# Patient Record
Sex: Female | Born: 1985 | Race: Black or African American | Hispanic: No | Marital: Single | Smoking: Never smoker
Health system: Southern US, Community
[De-identification: ages and names within clinical notes are randomized; demographics above are authoritative.]

## PROBLEM LIST (undated history)

## (undated) DIAGNOSIS — R87810 Cervical high risk human papillomavirus (HPV) DNA test positive: Secondary | ICD-10-CM

## (undated) DIAGNOSIS — G43909 Migraine, unspecified, not intractable, without status migrainosus: Secondary | ICD-10-CM

## (undated) DIAGNOSIS — R87610 Atypical squamous cells of undetermined significance on cytologic smear of cervix (ASC-US): Secondary | ICD-10-CM

## (undated) DIAGNOSIS — J45909 Unspecified asthma, uncomplicated: Secondary | ICD-10-CM

## (undated) HISTORY — DX: Atypical squamous cells of undetermined significance on cytologic smear of cervix (ASC-US): R87.610

## (undated) HISTORY — DX: Cervical high risk human papillomavirus (HPV) DNA test positive: R87.810

---

## 2012-06-27 ENCOUNTER — Emergency Department (HOSPITAL_COMMUNITY): Payer: Self-pay

## 2012-06-27 ENCOUNTER — Emergency Department (HOSPITAL_COMMUNITY)
Admission: EM | Admit: 2012-06-27 | Discharge: 2012-06-27 | Disposition: A | Discharge status: Home / Self Care | Payer: Self-pay | Attending: Emergency Medicine | Admitting: Emergency Medicine

## 2012-06-27 ENCOUNTER — Encounter (HOSPITAL_COMMUNITY): Payer: Self-pay | Admitting: *Deleted

## 2012-06-27 DIAGNOSIS — J40 Bronchitis, not specified as acute or chronic: Secondary | ICD-10-CM | POA: Insufficient documentation

## 2012-06-27 DIAGNOSIS — J329 Chronic sinusitis, unspecified: Secondary | ICD-10-CM | POA: Insufficient documentation

## 2012-06-27 DIAGNOSIS — Z79899 Other long term (current) drug therapy: Secondary | ICD-10-CM | POA: Insufficient documentation

## 2012-06-27 MED ORDER — AMOXICILLIN 500 MG PO CAPS: 500.0000 mg | ORAL_CAPSULE | Freq: Three times a day (TID) | ORAL | Status: DC

## 2012-06-27 NOTE — ED Provider Notes (Addendum)
History     CSN: [id number]  Arrival date & time 06/27/12  1229   First MD Initiated Contact with Patient 06/27/12 1302      Chief Complaint  Patient presents with  . Cough    (Consider location/radiation/quality/duration/timing/severity/associated sxs/prior treatment) Patient is a 26 y.o. female presenting with cough. The history is provided by the patient (pt complains of a cough). No language interpreter was used.  Cough This is a new problem. The current episode started 12 to 24 hours ago. The problem occurs constantly. The problem has not changed since onset.The cough is non-productive. There has been no fever. Pertinent negatives include no chest pain and no headaches. The treatment provided no relief. Risk factors: nothing. She is not a smoker. Her past medical history does not include bronchitis or pneumonia.    History reviewed. No pertinent past medical history.  History reviewed. No pertinent past surgical history.  History reviewed. No pertinent family history.  History  Substance Use Topics  . Smoking status: Never Smoker   . Smokeless tobacco: Not on file  . Alcohol Use: No    OB History    Grav Para Term Preterm Abortions TAB SAB Ect Mult Living                  Review of Systems  Constitutional: Negative for fatigue.  HENT: Negative for congestion, sinus pressure and ear discharge.   Eyes: Negative for discharge.  Respiratory: Positive for cough.   Cardiovascular: Negative for chest pain.  Gastrointestinal: Negative for abdominal pain and diarrhea.  Genitourinary: Negative for frequency and hematuria.  Musculoskeletal: Negative for back pain.  Skin: Negative for rash.  Neurological: Negative for seizures and headaches.  Hematological: Negative.   Psychiatric/Behavioral: Negative for hallucinations.    Allergies  Review of patient's allergies indicates no known allergies.  Home Medications   Current Outpatient Rx  Name  Route  Sig  Dispense   Refill  . ASPIRIN-ACETAMINOPHEN-CAFFEINE 250-250-65 MG PO TABS   Oral   Take 2 tablets by mouth. Migraine headaches         . PHENYLEPH-DOXYLAMINE-DM-APAP 5-6.25-10-325 MG PO CAPS   Oral   Take 1-2 capsules by mouth at bedtime.         . AMOXICILLIN 500 MG PO CAPS   Oral   Take 1 capsule (500 mg total) by mouth 3 (three) times daily.   21 capsule   0     BP 114/79  Pulse 74  Temp 98.2 F (36.8 C) (Oral)  Resp 18  Ht 5\' 2"  (1.575 m)  Wt 208 lb (94.348 kg)  BMI 38.04 kg/m2  SpO2 98%  LMP 05/13/2012  Physical Exam  Constitutional: She is oriented to person, place, and time. She appears well-developed.  HENT:  Head: Normocephalic and atraumatic.  Eyes: Conjunctivae normal and EOM are normal. No scleral icterus.  Neck: Neck supple. No thyromegaly present.  Cardiovascular: Normal rate and regular rhythm.  Exam reveals no gallop and no friction rub.   No murmur heard. Pulmonary/Chest: No stridor. She has no wheezes. She has no rales. She exhibits no tenderness.  Abdominal: She exhibits no distension. There is no tenderness. There is no rebound.  Musculoskeletal: Normal range of motion. She exhibits no edema.  Lymphadenopathy:    She has no cervical adenopathy.  Neurological: She is oriented to person, place, and time. Coordination normal.  Skin: No rash noted. No erythema.  Psychiatric: She has a normal mood and affect. Her behavior is  normal.    ED Course  Procedures (including critical care time)  Labs Reviewed - No data to display Dg Chest 2 View  06/27/2012  *RADIOLOGY REPORT*  Clinical Data: Cough  CHEST - 2 VIEW  Comparison: None.  Findings:  Lungs clear.  Heart size and pulmonary vascularity are normal.  No adenopathy.  No bone lesions.  IMPRESSION: Lungs clear.   Original Report Authenticated By: Bretta Bang, M.D.      1. Bronchitis   2. Sinusitis       MDM          Benny Lennert, MD 06/27/12 1440  Benny Lennert, MD 07/20/12  [phone]

## 2012-06-27 NOTE — ED Notes (Signed)
Cough,nasal congestion sputum yellow -green, sore throat.  Felt she had a fever.

## 2013-04-14 ENCOUNTER — Emergency Department (HOSPITAL_COMMUNITY)
Admission: EM | Admit: 2013-04-14 | Discharge: 2013-04-14 | Disposition: A | Discharge status: Home / Self Care | Payer: Self-pay | Attending: Emergency Medicine | Admitting: Emergency Medicine

## 2013-04-14 ENCOUNTER — Encounter (HOSPITAL_COMMUNITY): Payer: Self-pay | Admitting: Emergency Medicine

## 2013-04-14 DIAGNOSIS — G43909 Migraine, unspecified, not intractable, without status migrainosus: Secondary | ICD-10-CM | POA: Insufficient documentation

## 2013-04-14 DIAGNOSIS — J209 Acute bronchitis, unspecified: Secondary | ICD-10-CM | POA: Insufficient documentation

## 2013-04-14 DIAGNOSIS — J029 Acute pharyngitis, unspecified: Secondary | ICD-10-CM | POA: Insufficient documentation

## 2013-04-14 DIAGNOSIS — J40 Bronchitis, not specified as acute or chronic: Secondary | ICD-10-CM

## 2013-04-14 HISTORY — DX: Migraine, unspecified, not intractable, without status migrainosus: G43.909

## 2013-04-14 LAB — RAPID STREP SCREEN (MED CTR MEBANE ONLY): Streptococcus, Group A Screen (Direct): NEGATIVE

## 2013-04-14 MED ORDER — IBUPROFEN 400 MG PO TABS
600.0000 mg | ORAL_TABLET | Freq: Once | ORAL | Status: AC
  Administered 2013-04-14: 600 mg via ORAL
  Filled 2013-04-14: qty 2

## 2013-04-14 MED ORDER — AZITHROMYCIN 250 MG PO TABS: 250.0000 mg | ORAL_TABLET | Freq: Every day | ORAL | Status: DC

## 2013-04-14 NOTE — ED Provider Notes (Addendum)
CSN: [id number]     Arrival date & time 04/14/13  1233 History   First MD Initiated Contact with Patient 04/14/13 1312     Chief Complaint  Patient presents with  . Chest Pain  . Shortness of Breath  . Sore Throat    HPI Patient reports sore throat ear ache and some productive cough over the past 2-3 days.  No fever or chills.  No nausea or vomiting.  No chest pain.  No abdominal pain.  Symptoms are mild in severity.  Nothing worsens or improves her symptoms.  No recent sick contacts.  Went to work today and was sent home by her foster.   Past Medical History  Diagnosis Date  . Migraine    History reviewed. No pertinent past surgical history. No family history on file. History  Substance Use Topics  . Smoking status: Never Smoker   . Smokeless tobacco: Not on file  . Alcohol Use: No   OB History   Grav Para Term Preterm Abortions TAB SAB Ect Mult Living                 Review of Systems  All other systems reviewed and are negative.    Allergies  Review of patient's allergies indicates no known allergies.  Home Medications   Current Outpatient Rx  Name  Route  Sig  Dispense  Refill  . aspirin-acetaminophen-caffeine (EXCEDRIN MIGRAINE) 250-250-65 MG per tablet   Oral   Take 2 tablets by mouth every 6 (six) hours as needed (migraine). Migraine headaches         . DM-Phenylephrine-Acetaminophen (TYLENOL COLD MULTI-SYMPTOM) 10-5-325 MG/15ML LIQD   Oral   Take 30 mLs by mouth every 4 (four) hours as needed (congestion/sore throat).         . SUMAtriptan (IMITREX) 50 MG tablet   Oral   Take 50 mg by mouth every 2 (two) hours as needed for migraine. May repeat in 2 hours if headache persists or recurs.         Marland Kitchen         0    BP 112/60  Pulse 69  Temp(Src) 98.2 F (36.8 C) (Oral)  Resp 20  SpO2 100%  LMP 03/31/2013 Physical Exam  Nursing note and vitals reviewed. Constitutional: She is oriented to person, place, and time. She appears well-developed and  well-nourished. No distress.  HENT:  Head: Normocephalic and atraumatic.  Eyes: EOM are normal.  Neck: Normal range of motion.  Cardiovascular: Normal rate, regular rhythm and normal heart sounds.   Pulmonary/Chest: Effort normal and breath sounds normal.  Abdominal: Soft. She exhibits no distension. There is no tenderness.  Musculoskeletal: Normal range of motion.  Neurological: She is alert and oriented to person, place, and time.  Skin: Skin is warm and dry.  Psychiatric: She has a normal mood and affect. Judgment normal.    ED Course  Procedures (including critical care time) Labs Review Labs Reviewed  RAPID STREP SCREEN  CULTURE, GROUP A STREP   Imaging Review No results found.  MDM   1. Bronchitis    Likely upper respiratory tract infections/bronchitis. Could represent atypical PNA The patient is well-appearing.  She is nontoxic.  No hypoxia on exam.      Lyanne Co, MD 04/14/13 1411  Lyanne Co, MD 04/14/13 1411  Lyanne Co, MD 04/14/13 [phone]

## 2013-04-14 NOTE — ED Notes (Signed)
Thursday night pt developed a sore throat and states ear were hurting.  Pt has a cough that has started Friday

## 2013-04-14 NOTE — ED Notes (Signed)
Vital signs stable. 

## 2013-04-14 NOTE — ED Notes (Signed)
Pt states started feeling SOB last night while singing and feels it in her chest, soreness.

## 2013-04-14 NOTE — ED Notes (Signed)
Pt alert & oriented x4, stable gait. Patient given discharge instructions, paperwork & prescription(s). Patient  instructed to stop at the registration desk to finish any additional paperwork. Patient verbalized understanding. Pt left department w/ no further questions. 

## 2013-04-14 NOTE — ED Notes (Signed)
Pt states that she has had generalized aches and pain and some sob when she sings since she had the cough.  She wakes up at night sometimes sweating

## 2013-04-16 LAB — CULTURE, GROUP A STREP

## 2013-12-13 ENCOUNTER — Emergency Department (HOSPITAL_COMMUNITY)
Admission: EM | Admit: 2013-12-13 | Discharge: 2013-12-14 | Disposition: A | Discharge status: Home / Self Care | Payer: Self-pay | Attending: Emergency Medicine | Admitting: Emergency Medicine

## 2013-12-13 ENCOUNTER — Encounter (HOSPITAL_COMMUNITY): Payer: Self-pay | Admitting: Emergency Medicine

## 2013-12-13 ENCOUNTER — Emergency Department (HOSPITAL_COMMUNITY): Payer: Self-pay

## 2013-12-13 DIAGNOSIS — J45909 Unspecified asthma, uncomplicated: Secondary | ICD-10-CM | POA: Insufficient documentation

## 2013-12-13 DIAGNOSIS — G43909 Migraine, unspecified, not intractable, without status migrainosus: Secondary | ICD-10-CM | POA: Insufficient documentation

## 2013-12-13 DIAGNOSIS — Z3202 Encounter for pregnancy test, result negative: Secondary | ICD-10-CM | POA: Insufficient documentation

## 2013-12-13 DIAGNOSIS — Z792 Long term (current) use of antibiotics: Secondary | ICD-10-CM | POA: Insufficient documentation

## 2013-12-13 DIAGNOSIS — Z79899 Other long term (current) drug therapy: Secondary | ICD-10-CM | POA: Insufficient documentation

## 2013-12-13 DIAGNOSIS — J209 Acute bronchitis, unspecified: Secondary | ICD-10-CM

## 2013-12-13 HISTORY — DX: Unspecified asthma, uncomplicated: J45.909

## 2013-12-13 LAB — URINALYSIS, ROUTINE W REFLEX MICROSCOPIC
Bilirubin Urine: NEGATIVE
GLUCOSE, UA: NEGATIVE mg/dL
HGB URINE DIPSTICK: NEGATIVE
Ketones, ur: NEGATIVE mg/dL
Nitrite: NEGATIVE
PH: 6 (ref 5.0–8.0)
Protein, ur: NEGATIVE mg/dL
SPECIFIC GRAVITY, URINE: 1.025 (ref 1.005–1.030)
Urobilinogen, UA: 0.2 mg/dL (ref 0.0–1.0)

## 2013-12-13 LAB — URINE MICROSCOPIC-ADD ON

## 2013-12-13 LAB — PREGNANCY, URINE: PREG TEST UR: NEGATIVE

## 2013-12-13 NOTE — ED Notes (Signed)
Pt states she has been coughing, using her inhaler for her breathing without improvement. Pt also reports chest discomfort with breathing

## 2013-12-13 NOTE — ED Provider Notes (Signed)
CSN: [id number]     Arrival date & time 12/13/13  2311 History  This chart was scribed for Dione Boozeavid Ludwika Rodd, MD by Quintella ReichertMatthew Underwood, ED scribe.  This patient was seen in room [facility] and the patient's care was started at 11:57 PM.   Chief Complaint  Patient presents with  . Cough    The history is provided by the patient. No language interpreter was used.    HPI Comments: Heather Nicholson is a 28 y.o. female with h/o asthma who presents to the Emergency Department complaining of several hours of cough with associated chest tightness.  Pt states that she was driving about 6 hours ago when her chest suddenly became "tight."  She initially attributed this to indigestion but she then began coughing.  She states "I think I'm having a flare of my bronchitis."  Cough has been intermittently productive of sputum of an unknown color.  She has attempted to treat symptoms with her albuterol inhaler, without relief.  Pt does not smoke.    [facility] [place]   Past Medical History  Diagnosis Date  . Migraine   . Asthma     History reviewed. No pertinent past surgical history.  No family history on file.   History  Substance Use Topics  . Smoking status: Never Smoker   . Smokeless tobacco: Not on file  . Alcohol Use: No    OB History   Grav Para Term Preterm Abortions TAB SAB Ect Mult Living                   Review of Systems  All other systems reviewed and are negative.     Allergies  Review of patient's allergies indicates no known allergies.  Home Medications   Prior to Admission medications   Medication Sig Start Date End Date Taking? Authorizing Provider  albuterol (PROVENTIL HFA;VENTOLIN HFA) 108 (90 BASE) MCG/ACT inhaler Inhale into the lungs every 6 (six) hours as needed for wheezing or shortness of breath.   Yes Historical Provider, MD  aspirin-acetaminophen-caffeine (EXCEDRIN MIGRAINE) [phone] MG per tablet Take 2 tablets by mouth every 6 (six) hours  as needed (migraine). Migraine headaches   Yes Historical Provider, MD  budesonide-formoterol (SYMBICORT) 160-4.5 MCG/ACT inhaler Inhale 2 puffs into the lungs 2 (two) times daily.   Yes Historical Provider, MD  SUMAtriptan (IMITREX) 50 MG tablet Take 50 mg by mouth every 2 (two) hours as needed for migraine. May repeat in 2 hours if headache persists or recurs.   Yes Historical Provider, MD  azithromycin (ZITHROMAX Z-PAK) 250 MG tablet Take 1 tablet (250 mg total) by mouth daily. Take 2 tabs for first dose, then 1 tab for each additional dose 04/14/13   Lyanne CoKevin M Campos, MD  DM-Phenylephrine-Acetaminophen (TYLENOL COLD MULTI-SYMPTOM) 10-5-325 MG/15ML LIQD Take 30 mLs by mouth every 4 (four) hours as needed (congestion/sore throat).    Historical Provider, MD   BP 129/81  Pulse 65  Resp 20  Ht 5\' 2"  (1.575 m)  Wt 208 lb (94.348 kg)  BMI 38.03 kg/m2  SpO2 100%  LMP 11/13/2013  Physical Exam  Nursing note and vitals reviewed. Constitutional: She is oriented to person, place, and time. She appears well-developed and well-nourished. No distress.  HENT:  Head: Normocephalic and atraumatic.  Eyes: EOM are normal. Pupils are equal, round, and reactive to light.  Neck: Neck supple. No JVD present. No tracheal deviation present.  Cardiovascular: Normal rate, regular rhythm and normal heart sounds.  No murmur heard. Pulmonary/Chest: Effort normal. No respiratory distress. She has no wheezes. She has no rales.  Prolonged exhalation phase  Abdominal: Soft. Bowel sounds are normal. She exhibits no distension and no mass. There is no tenderness.  Musculoskeletal: Normal range of motion. She exhibits no edema.  Lymphadenopathy:    She has no cervical adenopathy.  Neurological: She is alert and oriented to person, place, and time. She has normal reflexes. No cranial nerve deficit. Coordination normal.  Skin: Skin is warm and dry. No rash noted.  Psychiatric: She has a normal mood and affect. Her  behavior is normal. Thought content normal.    ED Course  Procedures (including critical care time)  DIAGNOSTIC STUDIES: Oxygen Saturation is 100% on room air, normal by my interpretation.    COORDINATION OF CARE: 12:02 AM-Discussed treatment plan which includes CXR, prednisone, breathing treatment, and labs with pt at bedside and pt agreed to plan.     Labs Review Results for orders placed during the hospital encounter of 12/13/13  URINALYSIS, ROUTINE W REFLEX MICROSCOPIC      Result Value Ref Range   Color, Urine YELLOW  YELLOW   APPearance CLEAR  CLEAR   Specific Gravity, Urine 1.025  1.005 - 1.030   pH 6.0  5.0 - 8.0   Glucose, UA NEGATIVE  NEGATIVE mg/dL   Hgb urine dipstick NEGATIVE  NEGATIVE   Bilirubin Urine NEGATIVE  NEGATIVE   Ketones, ur NEGATIVE  NEGATIVE mg/dL   Protein, ur NEGATIVE  NEGATIVE mg/dL   Urobilinogen, UA 0.2  0.0 - 1.0 mg/dL   Nitrite NEGATIVE  NEGATIVE   Leukocytes, UA TRACE (*) NEGATIVE  PREGNANCY, URINE      Result Value Ref Range   Preg Test, Ur NEGATIVE  NEGATIVE  URINE MICROSCOPIC-ADD ON      Result Value Ref Range   Squamous Epithelial / LPF MANY (*) RARE   WBC, UA 0-2  <3 WBC/hpf   RBC / HPF 0-2  <3 RBC/hpf   Bacteria, UA MANY (*) RARE   Imaging Review Dg Chest 2 View  12/14/2013   CLINICAL DATA:  COUGH  EXAM: CHEST  2 VIEW  COMPARISON:  DG CHEST 2 VIEW dated 06/27/2012  FINDINGS: The heart size and mediastinal contours are within normal limits. Both lungs are clear. The visualized skeletal structures are unremarkable.  IMPRESSION: No active cardiopulmonary disease.   Electronically Signed   By: Salome HolmesHector  Cooper M.D.   On: 12/14/2013 01:15   MDM   Final diagnoses:  Acute bronchitis    Respiratory tract infection with bronchitis. She's given a dose of the prednisone and an albuterol with ipratropium nebulizer treatment with significant improvement. Chest x-ray was obtained to rule out pneumonia and was negative. Urinalysis does have many  bacteria but also many squamous epithelial cells suggesting contaminated specimen. She does not clinically have a UTI. At this time, no indication for antibiotics. She is discharged with a prescription for prednisone. She does have an albuterol inhaler for use at home.    I personally performed the services described in this documentation, which was scribed in my presence. The recorded information has been reviewed and is accurate.    Dione Boozeavid Tarika Mckethan, MD 12/14/13 [phone]

## 2013-12-14 MED ORDER — IPRATROPIUM-ALBUTEROL 0.5-2.5 (3) MG/3ML IN SOLN
3.0000 mL | Freq: Once | RESPIRATORY_TRACT | Status: AC
  Administered 2013-12-14: 3 mL via RESPIRATORY_TRACT
  Filled 2013-12-14: qty 3

## 2013-12-14 MED ORDER — PREDNISONE 50 MG PO TABS
60.0000 mg | ORAL_TABLET | Freq: Once | ORAL | Status: AC
  Administered 2013-12-14: 60 mg via ORAL
  Filled 2013-12-14 (×2): qty 1

## 2013-12-14 MED ORDER — PREDNISONE 20 MG PO TABS: 60.0000 mg | ORAL_TABLET | Freq: Every day | ORAL | Status: DC

## 2013-12-14 NOTE — Discharge Instructions (Signed)
Continue using your inhaler as needed.   Acute Bronchitis Bronchitis is inflammation of the airways that extend from the windpipe into the lungs (bronchi). The inflammation often causes mucus to develop. This leads to a cough, which is the most common symptom of bronchitis.  In acute bronchitis, the condition usually develops suddenly and goes away over time, usually in a couple weeks. Smoking, allergies, and asthma can make bronchitis worse. Repeated episodes of bronchitis may cause further lung problems.  CAUSES Acute bronchitis is most often caused by the same virus that causes a cold. The virus can spread from person to person (contagious).  SIGNS AND SYMPTOMS   Cough.   Fever.   Coughing up mucus.   Body aches.   Chest congestion.   Chills.   Shortness of breath.   Sore throat.  DIAGNOSIS  Acute bronchitis is usually diagnosed through a physical exam. Tests, such as chest X-rays, are sometimes done to rule out other conditions.  TREATMENT  Acute bronchitis usually goes away in a couple weeks. Often times, no medical treatment is necessary. Medicines are sometimes given for relief of fever or cough. Antibiotics are usually not needed but may be prescribed in certain situations. In some cases, an inhaler may be recommended to help reduce shortness of breath and control the cough. A cool mist vaporizer may also be used to help thin bronchial secretions and make it easier to clear the chest.  HOME CARE INSTRUCTIONS  Get plenty of rest.   Drink enough fluids to keep your urine clear or pale yellow (unless you have a medical condition that requires fluid restriction). Increasing fluids may help thin your secretions and will prevent dehydration.   Only take over-the-counter or prescription medicines as directed by your health care provider.   Avoid smoking and secondhand smoke. Exposure to cigarette smoke or irritating chemicals will make bronchitis worse. If you are a  smoker, consider using nicotine gum or skin patches to help control withdrawal symptoms. Quitting smoking will help your lungs heal faster.   Reduce the chances of another bout of acute bronchitis by washing your hands frequently, avoiding people with cold symptoms, and trying not to touch your hands to your mouth, nose, or eyes.   Follow up with your health care provider as directed.  SEEK MEDICAL CARE IF: Your symptoms do not improve after 1 week of treatment.  SEEK IMMEDIATE MEDICAL CARE IF:  You develop an increased fever or chills.   You have chest pain.   You have severe shortness of breath.  You have bloody sputum.   You develop dehydration.  You develop fainting.  You develop repeated vomiting.  You develop a severe headache. MAKE SURE YOU:   Understand these instructions.  Will watch your condition.  Will get help right away if you are not doing well or get worse. Document Released: 08/19/2004 Document Revised: 03/14/2013 Document Reviewed: 01/02/2013 [facility] Patient Information 2014 [place], [place].  Prednisone tablets What is this medicine? PREDNISONE (PRED ni sone) is a corticosteroid. It is commonly used to treat inflammation of the skin, joints, lungs, and other organs. Common conditions treated include asthma, allergies, and arthritis. It is also used for other conditions, such as blood disorders and diseases of the adrenal glands. This medicine may be used for other purposes; ask your health care provider or pharmacist if you have questions. COMMON BRAND NAME(S): Deltasone, Predone, Sterapred DS, Sterapred What should I tell my health care provider before I take this medicine? They need  to know if you have any of these conditions: -Cushing's syndrome -diabetes -glaucoma -heart disease -high blood pressure -infection (especially a virus infection such as chickenpox, cold sores, or herpes) -kidney disease -liver disease -mental  illness -myasthenia gravis -osteoporosis -seizures -stomach or intestine problems -thyroid disease -an unusual or allergic reaction to lactose, prednisone, other medicines, foods, dyes, or preservatives -pregnant or trying to get pregnant -breast-feeding How should I use this medicine? Take this medicine by mouth with a glass of water. Follow the directions on the prescription label. Take this medicine with food. If you are taking this medicine once a day, take it in the morning. Do not take more medicine than you are told to take. Do not suddenly stop taking your medicine because you may develop a severe reaction. Your doctor will tell you how much medicine to take. If your doctor wants you to stop the medicine, the dose may be slowly lowered over time to avoid any side effects. Talk to your pediatrician regarding the use of this medicine in children. Special care may be needed. Overdosage: If you think you have taken too much of this medicine contact a poison control center or emergency room at once. NOTE: This medicine is only for you. Do not share this medicine with others. What if I miss a dose? If you miss a dose, take it as soon as you can. If it is almost time for your next dose, talk to your doctor or health care professional. You may need to miss a dose or take an extra dose. Do not take double or extra doses without advice. What may interact with this medicine? Do not take this medicine with any of the following medications: -metyrapone -mifepristone This medicine may also interact with the following medications: -aminoglutethimide -amphotericin B -aspirin and aspirin-like medicines -barbiturates -certain medicines for diabetes, like glipizide or glyburide -cholestyramine -cholinesterase inhibitors -cyclosporine -digoxin -diuretics -ephedrine -female hormones, like estrogens and birth control pills -isoniazid -ketoconazole -NSAIDS, medicines for pain and inflammation,  like ibuprofen or naproxen -phenytoin -rifampin -toxoids -vaccines -warfarin This list may not describe all possible interactions. Give your health care provider a list of all the medicines, herbs, non-prescription drugs, or dietary supplements you use. Also tell them if you smoke, drink alcohol, or use illegal drugs. Some items may interact with your medicine. What should I watch for while using this medicine? Visit your doctor or health care professional for regular checks on your progress. If you are taking this medicine over a prolonged period, carry an identification card with your name and address, the type and dose of your medicine, and your doctor's name and address. This medicine may increase your risk of getting an infection. Tell your doctor or health care professional if you are around anyone with measles or chickenpox, or if you develop sores or blisters that do not heal properly. If you are going to have surgery, tell your doctor or health care professional that you have taken this medicine within the last twelve months. Ask your doctor or health care professional about your diet. You may need to lower the amount of salt you eat. This medicine may affect blood sugar levels. If you have diabetes, check with your doctor or health care professional before you change your diet or the dose of your diabetic medicine. What side effects may I notice from receiving this medicine? Side effects that you should report to your doctor or health care professional as soon as possible: -allergic reactions like skin rash,  itching or hives, swelling of the face, lips, or tongue -changes in emotions or moods -changes in vision -depressed mood -eye pain -fever or chills, cough, sore throat, pain or difficulty passing urine -increased thirst -swelling of ankles, feet Side effects that usually do not require medical attention (report to your doctor or health care professional if they continue or are  bothersome): -confusion, excitement, restlessness -headache -nausea, vomiting -skin problems, acne, thin and shiny skin -trouble sleeping -weight gain This list may not describe all possible side effects. Call your doctor for medical advice about side effects. You may report side effects to FDA at 1-800-FDA-1088. Where should I keep my medicine? Keep out of the reach of children. Store at room temperature between 15 and 30 degrees C (59 and 86 degrees F). Protect from light. Keep container tightly closed. Throw away any unused medicine after the expiration date. NOTE: This sheet is a summary. It may not cover all possible information. If you have questions about this medicine, talk to your doctor, pharmacist, or health care provider.  2014, [employer]. (2011-02-25 10:57:14)

## 2015-09-08 ENCOUNTER — Emergency Department (HOSPITAL_COMMUNITY): Payer: Self-pay

## 2015-09-08 ENCOUNTER — Encounter (HOSPITAL_COMMUNITY): Payer: Self-pay | Admitting: Emergency Medicine

## 2015-09-08 DIAGNOSIS — G8929 Other chronic pain: Secondary | ICD-10-CM | POA: Insufficient documentation

## 2015-09-08 DIAGNOSIS — Z79899 Other long term (current) drug therapy: Secondary | ICD-10-CM | POA: Insufficient documentation

## 2015-09-08 DIAGNOSIS — J45909 Unspecified asthma, uncomplicated: Secondary | ICD-10-CM | POA: Insufficient documentation

## 2015-09-08 DIAGNOSIS — Z791 Long term (current) use of non-steroidal anti-inflammatories (NSAID): Secondary | ICD-10-CM | POA: Insufficient documentation

## 2015-09-08 DIAGNOSIS — Z7952 Long term (current) use of systemic steroids: Secondary | ICD-10-CM | POA: Insufficient documentation

## 2015-09-08 DIAGNOSIS — Z792 Long term (current) use of antibiotics: Secondary | ICD-10-CM | POA: Insufficient documentation

## 2015-09-08 DIAGNOSIS — M79671 Pain in right foot: Secondary | ICD-10-CM | POA: Insufficient documentation

## 2015-09-08 DIAGNOSIS — G43909 Migraine, unspecified, not intractable, without status migrainosus: Secondary | ICD-10-CM | POA: Insufficient documentation

## 2015-09-08 DIAGNOSIS — Z7951 Long term (current) use of inhaled steroids: Secondary | ICD-10-CM | POA: Insufficient documentation

## 2015-09-08 NOTE — ED Notes (Signed)
Pt states she had right foot injury 1.5 months ago while wearing heels.

## 2015-09-09 ENCOUNTER — Emergency Department (HOSPITAL_COMMUNITY)
Admission: EM | Admit: 2015-09-09 | Discharge: 2015-09-09 | Disposition: A | Discharge status: Home / Self Care | Payer: Self-pay | Attending: Emergency Medicine | Admitting: Emergency Medicine

## 2015-09-09 DIAGNOSIS — M79671 Pain in right foot: Secondary | ICD-10-CM

## 2015-09-09 DIAGNOSIS — G8929 Other chronic pain: Secondary | ICD-10-CM

## 2015-09-09 MED ORDER — NAPROXEN 500 MG PO TABS: 500.0000 mg | ORAL_TABLET | Freq: Two times a day (BID) | ORAL | Status: DC

## 2015-09-09 MED ORDER — NAPROXEN 250 MG PO TABS
500.0000 mg | ORAL_TABLET | Freq: Once | ORAL | Status: AC
  Administered 2015-09-09: 500 mg via ORAL
  Filled 2015-09-09: qty 2

## 2015-09-09 NOTE — Discharge Instructions (Signed)
As discussed, I suspect you may have a ligament or soft tissue injury that is made worse by wearing your high heeled shoes.  Wear the post op shoe to help prevent stress on your foot.  Ice and minimizing stress to the foot can also be helpful. Call the orthopedist listed for further evaluation of your foot.

## 2015-09-10 NOTE — ED Provider Notes (Signed)
CSN: [id number]     Arrival date & time 09/08/15  2138 History   First MD Initiated Contact with Patient 09/09/15 0046     Chief Complaint  Patient presents with  . Foot Pain     (Consider location/radiation/quality/duration/timing/severity/associated sxs/prior Treatment) The history is provided by the patient and a friend.   Heather Nicholson is a 30 y.o. female presenting with right foot pain which has been chronic with waxing and waning symptoms since having an inversion injury of the foot while walking in high heel shoes approximately 6 weeks ago.  She reports tolerable pain during the week but wears heels for church on Sunday and notices increased pain and swelling of the foot by Sunday evening and Monday, then becomes more tolerable. Pain is aching in character, sharp at times and does not radiate. She has taken excedrin without relief.  She denies numbness in the foot or toes.     Past Medical History  Diagnosis Date  . Migraine   . Asthma    History reviewed. No pertinent past surgical history. History reviewed. No pertinent family history. Social History  Substance Use Topics  . Smoking status: Never Smoker   . Smokeless tobacco: None  . Alcohol Use: Yes   OB History    No data available     Review of Systems  Constitutional: Negative for fever.  Musculoskeletal: Positive for joint swelling and arthralgias. Negative for myalgias.  Neurological: Negative for weakness and numbness.      Allergies  Review of patient's allergies indicates no known allergies.  Home Medications   Prior to Admission medications   Medication Sig Start Date End Date Taking? Authorizing Provider  albuterol (PROVENTIL HFA;VENTOLIN HFA) 108 (90 BASE) MCG/ACT inhaler Inhale into the lungs every 6 (six) hours as needed for wheezing or shortness of breath.    Historical Provider, MD  aspirin-acetaminophen-caffeine (EXCEDRIN MIGRAINE) [phone] MG per tablet Take 2 tablets by mouth every 6  (six) hours as needed (migraine). Migraine headaches    Historical Provider, MD  azithromycin (ZITHROMAX Z-PAK) 250 MG tablet Take 1 tablet (250 mg total) by mouth daily. Take 2 tabs for first dose, then 1 tab for each additional dose 04/14/13   Azalia Bilis, MD  budesonide-formoterol [facility]) 160-4.5 MCG/ACT inhaler Inhale 2 puffs into the lungs 2 (two) times daily.    Historical Provider, MD  DM-Phenylephrine-Acetaminophen (TYLENOL COLD MULTI-SYMPTOM) 10-5-325 MG/15ML LIQD Take 30 mLs by mouth every 4 (four) hours as needed (congestion/sore throat).    Historical Provider, MD  naproxen (NAPROSYN) 500 MG tablet Take 1 tablet (500 mg total) by mouth 2 (two) times daily. 09/09/15   Burgess Amor, PA-C  predniSONE (DELTASONE) 20 MG tablet Take 3 tablets (60 mg total) by mouth daily. 12/14/13   Dione Booze, MD  SUMAtriptan (IMITREX) 50 MG tablet Take 50 mg by mouth every 2 (two) hours as needed for migraine. May repeat in 2 hours if headache persists or recurs.    Historical Provider, MD   BP 114/79 mmHg  Pulse 65  Temp(Src) 98.1 F (36.7 C) (Oral)  Resp 20  Ht  (1.575 m)  Wt 98.884 kg  BMI 39.86 kg/m2  SpO2 100%  LMP 09/08/2015 Physical Exam  Constitutional: She appears well-developed and well-nourished.  HENT:  Head: Atraumatic.  Neck: Normal range of motion.  Cardiovascular:  Pulses equal bilaterally  Musculoskeletal: She exhibits tenderness.       Right foot: There is swelling. There is normal range of motion, normal  capillary refill, no crepitus and no deformity.       Feet:  Neurological: She is alert. She has normal strength. She displays normal reflexes. No sensory deficit.  Skin: Skin is warm and dry.  Psychiatric: She has a normal mood and affect.    ED Course  Procedures (including critical care time) Labs Review Labs Reviewed - No data to display  Imaging Review Dg Foot Complete Right  09/08/2015  CLINICAL DATA:  30 year old female with right foot and ankle pain  after stepping off a curb in high heels this past October. Persistent intermittent pain and swelling. EXAM: RIGHT FOOT COMPLETE - 3+ VIEW COMPARISON:  None. FINDINGS: There is no evidence of fracture or dislocation. There is no evidence of arthropathy or other focal bone abnormality. Soft tissues are unremarkable. IMPRESSION: Negative. Electronically Signed   By: Malachy Moan M.D.   On: 09/08/2015 22:41   I have personally reviewed and evaluated these images and lab results as part of my medical decision-making.   EKG Interpretation None      MDM   Final diagnoses:  Chronic foot pain, right      Radiological studies were viewed, interpreted and considered during the medical decision making and disposition process. I agree with radiologists reading.  Results were also discussed with patient. Advised to dc high heel shoes. Prescribed naproxen, encouraged ice, elevation, post op shoe provided.  Ortho f/u recommended for further eval.  Suspect possible ligament/tendon sprain but cannot rule out complete tear or other surgical injury.    The patient appears reasonably screened and/or stabilized for discharge and I doubt any other medical condition or other [facility] requiring further screening, evaluation, or treatment in the ED at this time prior to discharge.      Burgess Amor, PA-C 09/10/15 0981  Layla Maw Ward, DO 09/10/15 0430

## 2016-06-26 IMAGING — DX DG FOOT COMPLETE 3+V*R*
3 series · 3 of 3 positions shown · non-contrast
Comparison: None.

CLINICAL DATA: 30-year-old female with right foot and ankle pain
after stepping off a curb in high heels this past Roly Rolando.
Persistent intermittent pain and swelling.

EXAM:
RIGHT FOOT COMPLETE - 3+ VIEW

[foot ap]
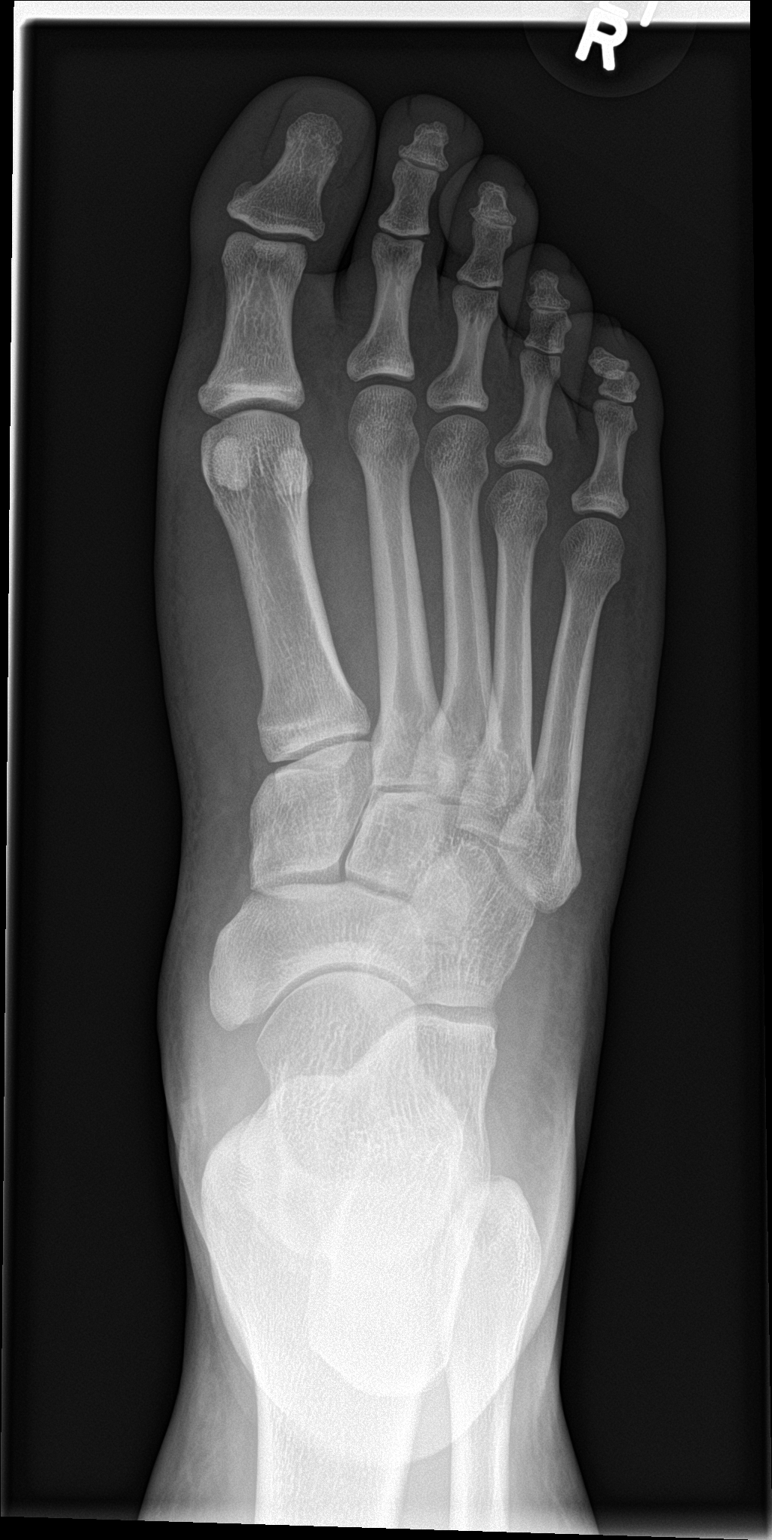

[foot obl]
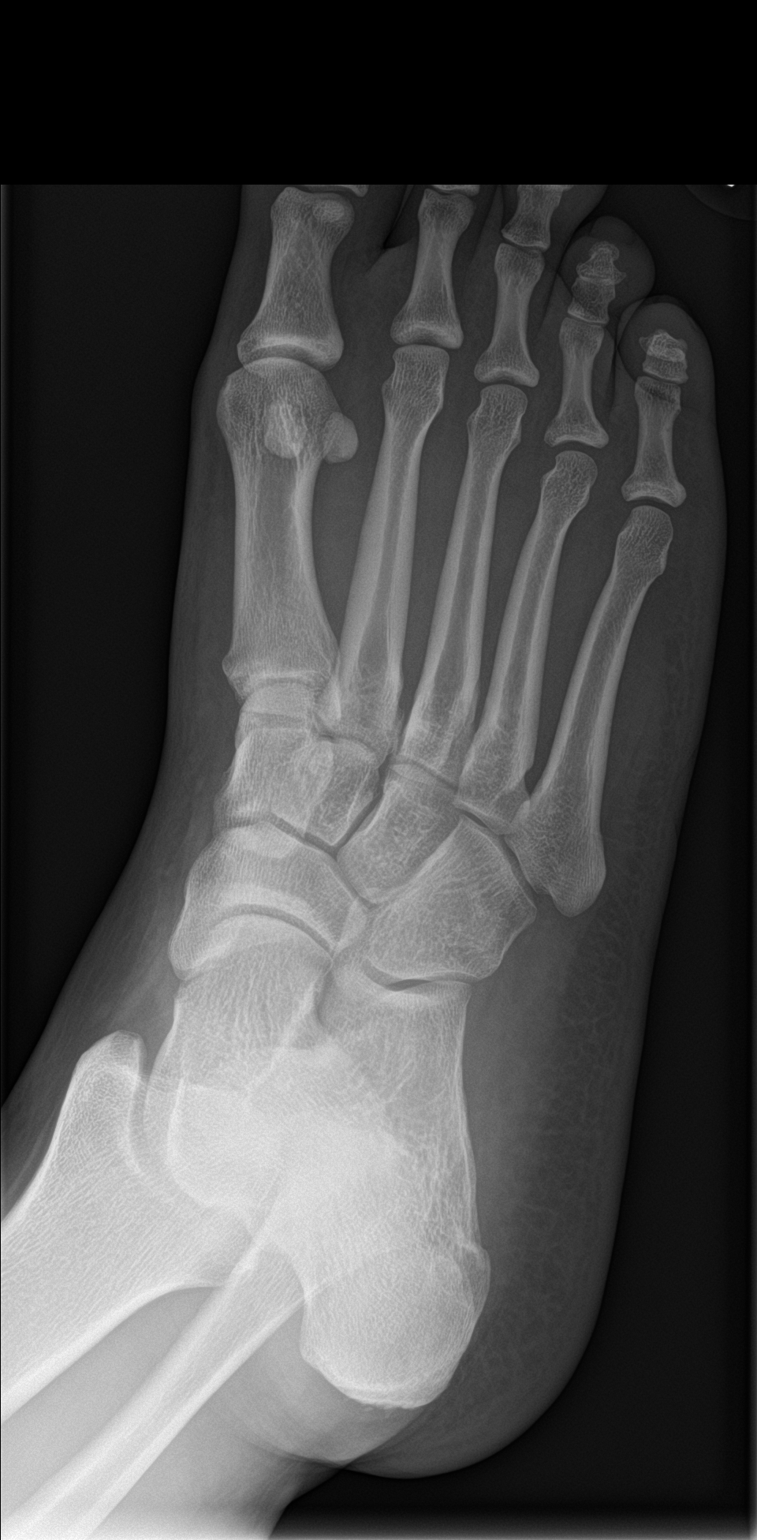

[foot lat]
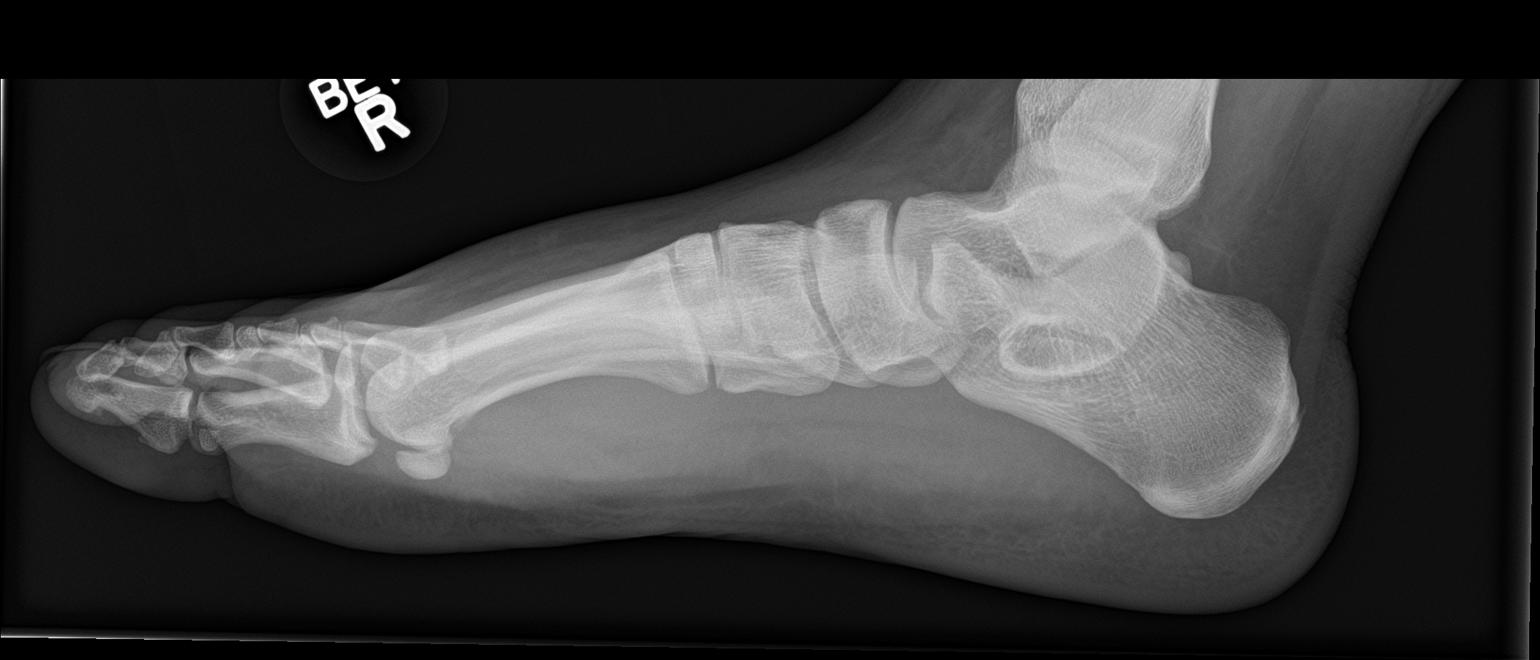

[3 of 3 positions shown; findings below may reference images not displayed]

FINDINGS: There is no evidence of fracture or dislocation. There is no
evidence of arthropathy or other focal bone abnormality. Soft
tissues are unremarkable.
IMPRESSION: Negative.

## 2017-07-10 ENCOUNTER — Other Ambulatory Visit: Payer: Self-pay

## 2017-07-10 ENCOUNTER — Emergency Department (HOSPITAL_COMMUNITY): Payer: BLUE CROSS/BLUE SHIELD

## 2017-07-10 ENCOUNTER — Encounter (HOSPITAL_COMMUNITY): Payer: Self-pay | Admitting: *Deleted

## 2017-07-10 ENCOUNTER — Emergency Department (HOSPITAL_COMMUNITY)
Admission: EM | Admit: 2017-07-10 | Discharge: 2017-07-10 | Disposition: A | Discharge status: Home / Self Care | Payer: BLUE CROSS/BLUE SHIELD | Attending: Emergency Medicine | Admitting: Emergency Medicine

## 2017-07-10 DIAGNOSIS — R197 Diarrhea, unspecified: Secondary | ICD-10-CM | POA: Insufficient documentation

## 2017-07-10 DIAGNOSIS — R1012 Left upper quadrant pain: Secondary | ICD-10-CM | POA: Diagnosis present

## 2017-07-10 DIAGNOSIS — J45909 Unspecified asthma, uncomplicated: Secondary | ICD-10-CM | POA: Insufficient documentation

## 2017-07-10 DIAGNOSIS — Z79899 Other long term (current) drug therapy: Secondary | ICD-10-CM | POA: Insufficient documentation

## 2017-07-10 DIAGNOSIS — R112 Nausea with vomiting, unspecified: Secondary | ICD-10-CM | POA: Insufficient documentation

## 2017-07-10 LAB — URINALYSIS, ROUTINE W REFLEX MICROSCOPIC
BACTERIA UA: NONE SEEN
BILIRUBIN URINE: NEGATIVE
GLUCOSE, UA: NEGATIVE mg/dL
Ketones, ur: NEGATIVE mg/dL
LEUKOCYTES UA: NEGATIVE
NITRITE: NEGATIVE
PROTEIN: NEGATIVE mg/dL
SPECIFIC GRAVITY, URINE: 1.028 (ref 1.005–1.030)
pH: 5 (ref 5.0–8.0)

## 2017-07-10 LAB — COMPREHENSIVE METABOLIC PANEL
ALT: 17 U/L (ref 14–54)
ANION GAP: 7 (ref 5–15)
AST: 19 U/L (ref 15–41)
Albumin: 3.4 g/dL — ABNORMAL LOW (ref 3.5–5.0)
Alkaline Phosphatase: 62 U/L (ref 38–126)
BILIRUBIN TOTAL: 0.7 mg/dL (ref 0.3–1.2)
BUN: 8 mg/dL (ref 6–20)
CHLORIDE: 109 mmol/L (ref 101–111)
CO2: 22 mmol/L (ref 22–32)
Calcium: 8.1 mg/dL — ABNORMAL LOW (ref 8.9–10.3)
Creatinine, Ser: 0.82 mg/dL (ref 0.44–1.00)
GFR calc non Af Amer: >60 mL/min (ref >60)
Glucose, Bld: 92 mg/dL (ref 65–99)
POTASSIUM: 3.6 mmol/L (ref 3.5–5.1)
Sodium: 138 mmol/L (ref 135–145)
TOTAL PROTEIN: 7.1 g/dL (ref 6.5–8.1)

## 2017-07-10 LAB — CBC
HEMATOCRIT: 37.2 % (ref 36.0–46.0)
Hemoglobin: 12.2 g/dL (ref 12.0–15.0)
MCH: 28.9 pg (ref 26.0–34.0)
MCHC: 32.8 g/dL (ref 30.0–36.0)
MCV: 88.2 fL (ref 78.0–100.0)
Platelets: 302 10*3/uL (ref 150–400)
RBC: 4.22 MIL/uL (ref 3.87–5.11)
RDW: 13.4 % (ref 11.5–15.5)
WBC: 5.5 10*3/uL (ref 4.0–10.5)

## 2017-07-10 LAB — I-STAT BETA HCG BLOOD, ED (MC, WL, AP ONLY): I-stat hCG, quantitative: <5 m[IU]/mL (ref <5)

## 2017-07-10 LAB — LIPASE, BLOOD: LIPASE: 36 U/L (ref 11–51)

## 2017-07-10 MED ORDER — IOPAMIDOL (ISOVUE-300) INJECTION 61%
INTRAVENOUS | Status: AC
  Administered 2017-07-10: 100 mL
  Filled 2017-07-10: qty 50

## 2017-07-10 MED ORDER — ONDANSETRON HCL 4 MG PO TABS: 4.0000 mg | ORAL_TABLET | Freq: Four times a day (QID) | ORAL | 0 refills | Status: DC

## 2017-07-10 MED ORDER — KETOROLAC TROMETHAMINE 15 MG/ML IJ SOLN
15.0000 mg | Freq: Once | INTRAMUSCULAR | Status: AC
  Administered 2017-07-10: 15 mg via INTRAVENOUS
  Filled 2017-07-10: qty 1

## 2017-07-10 MED ORDER — MORPHINE SULFATE (PF) 4 MG/ML IV SOLN
4.0000 mg | Freq: Once | INTRAVENOUS | Status: AC
  Administered 2017-07-10: 4 mg via INTRAVENOUS
  Filled 2017-07-10: qty 1

## 2017-07-10 MED ORDER — ONDANSETRON HCL 4 MG/2ML IJ SOLN
4.0000 mg | Freq: Once | INTRAMUSCULAR | Status: AC
  Administered 2017-07-10: 4 mg via INTRAVENOUS
  Filled 2017-07-10: qty 2

## 2017-07-10 NOTE — ED Notes (Signed)
PA at bedside.

## 2017-07-10 NOTE — ED Notes (Signed)
Pt transported to CT ?

## 2017-07-10 NOTE — Discharge Instructions (Signed)
Please read attached information. If you experience any new or worsening signs or symptoms please return to the emergency room for evaluation. Please follow-up with your primary care provider or specialist as discussed. Please use medication prescribed only as directed and discontinue taking if you have any concerning signs or symptoms.   °

## 2017-07-10 NOTE — ED Provider Notes (Signed)
[facility] Provider Note   CSN: [id number] Arrival date & time: 07/10/17  1224     History   Chief Complaint Chief Complaint  Patient presents with  . Abdominal Pain    HPI Heather Nicholson is a 31 y.o. female.  HPI   31 year old female presents today with complaints of 4 days of.  Patient notes initially this started as a sharp stabbing pain in her left flank.  She notes this has progressed to left-sided abdominal pain with associated nausea vomiting and diarrhea.  Patient denies any blood in the vomit or diarrhea.  She denies any fever at home.  Patient notes that she was seen at an outside facility, diagnosed with flank pain and discharged.  Patient reports gave Toradol prior to her disposition today.   Past Medical History:  Diagnosis Date  . Asthma   . Migraine     There are no active problems to display for this patient.   History reviewed. No pertinent surgical history.  OB History    No data available       Home Medications    Prior to Admission medications   Medication Sig Start Date End Date Taking? Authorizing Provider  aspirin-acetaminophen-caffeine (EXCEDRIN MIGRAINE) [phone] MG per tablet Take 2 tablets by mouth every 6 (six) hours as needed (migraine). Migraine headaches   Yes [provider]  Aspirin-Salicylamide-Caffeine (BC HEADACHE POWDER PO) Take 1 Package by mouth as needed (headache).   Yes [provider]  loperamide (IMODIUM) 2 MG capsule Take 4 mg by mouth as needed for diarrhea or loose stools.   Yes [provider]  naproxen (NAPROSYN) 500 MG tablet Take 1 tablet (500 mg total) by mouth 2 (two) times daily. Patient not taking: Reported on 07/10/2017 09/09/15   Burgess AmorIdol, Julie, PA-C  ondansetron (ZOFRAN) 4 MG tablet Take 1 tablet (4 mg total) by mouth every 6 (six) hours. 07/10/17   Eleanna Theilen, Tinnie GensJeffrey, PA-C  SUMAtriptan (IMITREX) 50 MG tablet Take 50 mg by mouth every 2 (two)  hours as needed for migraine. May repeat in 2 hours if headache persists or recurs.    [provider]    Family History History reviewed. No pertinent family history.  Social History Social History   Tobacco Use  . Smoking status: Never Smoker  Substance Use Topics  . Alcohol use: Yes  . Drug use: No     Allergies   Citrus   Review of Systems Review of Systems  All other systems reviewed and are negative.    Physical Exam Updated Vital Signs BP (!) 100/56   Pulse 69   Temp 98 F (36.7 C) (Oral)   Resp 16   Ht 5\' 2"  (1.575 m)   Wt 97.5 kg (215 lb)   LMP 07/08/2017   SpO2 100%   BMI 39.32 kg/m   Physical Exam  Constitutional: She is oriented to person, place, and time. She appears well-developed and well-nourished.  HENT:  Head: Normocephalic and atraumatic.  Eyes: Conjunctivae are normal. Pupils are equal, round, and reactive to light. Right eye exhibits no discharge. Left eye exhibits no discharge. No scleral icterus.  Neck: Normal range of motion. No JVD present. No tracheal deviation present.  Pulmonary/Chest: Effort normal. No stridor.  Abdominal: Soft. She exhibits no distension and no mass. There is tenderness. There is no rebound and no guarding. No hernia.  Minor TTP of left upper quadrant   Musculoskeletal: Normal range of motion.  Neurological: She  is alert and oriented to person, place, and time. Coordination normal.  Psychiatric: She has a normal mood and affect. Her behavior is normal. Judgment and thought content normal.  Nursing note and vitals reviewed.    ED Treatments / Results  Labs (all labs ordered are listed, but only abnormal results are displayed) Labs Reviewed  COMPREHENSIVE METABOLIC PANEL - Abnormal; Notable for the following components:      Result Value   Calcium 8.1 (*)    Albumin 3.4 (*)    All other components within normal limits  URINALYSIS, ROUTINE W REFLEX MICROSCOPIC - Abnormal; Notable for the following  components:   APPearance HAZY (*)    Hgb urine dipstick LARGE (*)    Squamous Epithelial / LPF 0-5 (*)    All other components within normal limits  LIPASE, BLOOD  CBC  I-STAT BETA HCG BLOOD, ED ([facility], [facility], [facility] ONLY)    EKG  EKG Interpretation None       Radiology Ct Abdomen Pelvis W Contrast  Result Date: 07/10/2017 CLINICAL DATA:  Left-sided abdominal pain. EXAM: CT ABDOMEN AND PELVIS WITH CONTRAST TECHNIQUE: Multidetector CT imaging of the abdomen and pelvis was performed using the standard protocol following bolus administration of intravenous contrast. CONTRAST:  ISOVUE-300 IOPAMIDOL (ISOVUE-300) INJECTION 61% COMPARISON:  None. FINDINGS: Lower chest: No pleural effusion. Small focal area of subpleural consolidation is identified in the left lower lobe adjacent the left heart border. Hepatobiliary: No focal liver abnormality is seen. No gallstones, gallbladder wall thickening, or biliary dilatation. Pancreas: Unremarkable. No pancreatic ductal dilatation or surrounding inflammatory changes. Spleen: Normal in size without focal abnormality. Adrenals/Urinary Tract: Normal appearance of the adrenal glands. Low-density structure in the right kidney is too small to characterize measuring 7 mm. No mass or hydronephrosis noted. Urinary bladder appears normal. Stomach/Bowel: The stomach is normal. The small bowel loops have a normal course and caliber. No obstruction. No pathologic dilatation of the colon. The appendix is not visualized. No abnormal bowel inflammation. Vascular/Lymphatic: Normal appearance of the abdominal aorta. No enlarged retroperitoneal or mesenteric adenopathy. No enlarged pelvic or inguinal lymph nodes. Reproductive: Uterus and bilateral adnexa are unremarkable. Other: No abdominal wall hernia or abnormality. No abdominopelvic ascites. Musculoskeletal: No aggressive lytic or sclerotic bone lesions. IMPRESSION: 1. No acute findings within the abdomen or pelvis. No  explanation for left-sided abdominal pain. 2. Focal area of consolidation within the left lower lobe adjacent to the left heart border. This is of uncertain significance. Electronically Signed   By: Signa Kell M.D.   On: 07/10/2017 15:18    Procedures Procedures (including critical care time)  Medications Ordered in ED Medications  ketorolac (TORADOL) 15 MG/ML injection 15 mg (15 mg Intravenous Given 07/10/17 1328)  iopamidol (ISOVUE-300) 61 % injection (100 mLs  Contrast Given 07/10/17 1352)  morphine 4 MG/ML injection 4 mg (4 mg Intravenous Given 07/10/17 1446)  ondansetron (ZOFRAN) injection 4 mg (4 mg Intravenous Given 07/10/17 1445)     Initial Impression / Assessment and Plan / ED Course  I have reviewed the triage vital signs and the nursing notes.  Pertinent labs & imaging results that were available during my care of the patient were reviewed by me and considered in my medical decision making (see chart for details).      Final Clinical Impressions(s) / ED Diagnoses   Final diagnoses:  Left upper quadrant pain    Labs: I-STAT beta-hCG, urinalysis, lipase, CMP, CBC  Imaging: CT abdomen pelvis with contrast  Consults:  Therapeutics:  Discharge Meds:   Assessment/Plan: 31 year old female presents today with abdominal pain.  This is her second ED visit today with similar complaints.  Due to ongoing complaints of severe pain labs and abdominal films ordered.  No significant findings noted on labs or CT.  This is likely gastroenteritis given her vomiting and diarrhea.  She is afebrile, she has very reassuring workup.  She will be discharged with symptomatic care instructions and strict return precautions.  She verbalized understanding and agreement to today's plan.   Incidental finding of focal area of consolidation within the left lower lobe adjacent to the left heart border this is of unknown significance based on CT.  Patient without upper respiratory complaints,  fever, no signs of pneumonia.  Family and patient made aware of this finding and encouraged to watch for any new or worsening signs or symptoms with continued return precautions.   ED Discharge Orders        Ordered    ondansetron (ZOFRAN) 4 MG tablet  Every 6 hours     07/10/17 1529       Eyvonne MechanicHedges, Lemont Sitzmann, PA-C 07/10/17 1531    Ida Uppal, Tinnie GensJeffrey, PA-C 07/10/17 1546    Gerhard MunchLockwood, Robert, MD 07/10/17 [phone]

## 2017-07-10 NOTE — ED Notes (Signed)
After returning from CT patient states her pain has returned.

## 2017-07-10 NOTE — ED Triage Notes (Signed)
Pt reports onset of left side abd pain/flank pain since yesterday. Having frequent bowel movements and n/v. Denies urinary symptoms or fever, +fatigue. Went to [facility] and was dc home with just "flank pain" but reports no tests were done.

## 2017-12-24 DIAGNOSIS — R87610 Atypical squamous cells of undetermined significance on cytologic smear of cervix (ASC-US): Secondary | ICD-10-CM

## 2017-12-24 HISTORY — DX: Atypical squamous cells of undetermined significance on cytologic smear of cervix (ASC-US): R87.610

## 2017-12-28 ENCOUNTER — Ambulatory Visit: Payer: BLUE CROSS/BLUE SHIELD | Admitting: Gynecology

## 2017-12-28 ENCOUNTER — Encounter: Payer: Self-pay | Admitting: Gynecology

## 2017-12-28 VITALS — BP 118/74 | Ht 62.0 in | Wt 240.0 lb

## 2017-12-28 DIAGNOSIS — R102 Pelvic and perineal pain: Secondary | ICD-10-CM

## 2017-12-28 DIAGNOSIS — Z01411 Encounter for gynecological examination (general) (routine) with abnormal findings: Secondary | ICD-10-CM

## 2017-12-28 DIAGNOSIS — Z113 Encounter for screening for infections with a predominantly sexual mode of transmission: Secondary | ICD-10-CM

## 2017-12-28 DIAGNOSIS — Z1151 Encounter for screening for human papillomavirus (HPV): Secondary | ICD-10-CM

## 2017-12-28 DIAGNOSIS — N941 Unspecified dyspareunia: Secondary | ICD-10-CM

## 2017-12-28 NOTE — Progress Notes (Signed)
Heather Nicholson Weight 07/03/1986 [id number]        32 y.o.  G0P0000 new patient for annual exam.  Patient also complaining of years history of left leg pain that comes and goes sharp stabbing to aching.  Seems to be getting worse over the last several months.  Last 2 episodes occurred right before her menses.  Having regular monthly menses without irregular bleeding.  Not using contraception but sexually active.  Did have CT scan December 2018 for left-sided pain but she said that was a higher pain in her abdomen and the CT scan was normal to include reproductive organs.  Does have a history of ovarian cyst on the left years ago that caused pain and resolved spontaneously on monitoring.  Patient does request STD screening.  No known exposure but wants to be screened.  Reports last Pap smear years ago.  No history of abnormal Pap smears previously.  Past medical history,surgical history, problem list, medications, allergies, family history and social history were all reviewed and documented as reviewed in the EPIC chart.  ROS:  Performed with pertinent positives and negatives included in the history, assessment and plan.   Additional significant findings : None   Exam: Kennon PortelaKim Gardner assistant Vitals:   12/28/17 1148  BP: 118/74  Weight: 240 lb (108.9 kg)  Height: 5\' 2"  (1.575 m)   Body mass index is 43.9 kg/m.  General appearance:  Normal affect, orientation and appearance. Skin: Grossly normal HEENT: Without gross lesions.  No cervical or supraclavicular adenopathy. Thyroid normal.  Lungs:  Clear without wheezing, rales or rhonchi Cardiac: RR, without RMG Abdominal:  Soft, nontender, without masses, guarding, rebound, organomegaly or hernia Breasts:  Examined lying and sitting without masses, retractions, discharge or axillary adenopathy. Pelvic:  Ext, BUS, Vagina:   Cervix: Normal.  Pap smear/HPV, GC/Chlamydia  Uterus: Axial to anteverted, normal size, shape and contour, midline and mobile  nontender   Adnexa: Without masses or tenderness    Anus and perineum: Normal   Rectovaginal: Normal sphincter tone without palpated masses or tenderness.    Assessment/Plan:  32 y.o. G0P0000 female for annual gynecologic exam with regular menses, no contraception.   1. Contraceptive management.  We reviewed contraceptive options.  At this point she is not interested in contraception.  She clearly understands the risk of pregnancy.  I recommended she start a multivitamin with folic acid in the event she becomes pregnant for preconceptual vitamin supplementation.  I asked her to think about her options and follow-up with a final decision when she returns for ultrasound as noted below. 2. Left pelvic pain/intermittent dyspareunia.  Has pain with intercourse sometimes but not always.  Has intermittent left pelvic pain sporadically/spontaneously.  Has been going on for "years".  No associated nausea vomiting diarrhea constipation or urinary tract infection symptoms such as frequency dysuria urgency low back pain fever or chills.  Had CT scan which was normal in December.  History of ovarian cyst in the past.  Exam today is normal.  Recommend GYN ultrasound now for pelvic clearance.  Options for diagnostic laparoscopy if persists.  Will rediscuss after the ultrasound. 3. Breast health.  Breast exam normal today.  SBE monthly reviewed. 4. Pap smear a number of years ago.  Pap smear/HPV done today.  No history of abnormal Pap smears previously. 5. STD screening requested.  No known exposure.  GC/chlamydia, HIV, RPR, hepatitis B, hepatitis C ordered.  No history of abnormal discharge, irritation/itching or odor.  Exam normal today.  6. Health maintenance.  No routine lab work done as patient reports this going to be done through her primary physician's office.  Follow-up for ultrasound.  Follow-up in 1 year for annual exam.   Heather Coombes Fontaine MD, 12:12 PM 12/28/2017

## 2017-12-28 NOTE — Addendum Note (Signed)
Addended by: Dayna BarkerGARDNER, KIMBERLY K on: 12/28/2017 12:59 PM   Modules accepted: Orders

## 2017-12-28 NOTE — Patient Instructions (Signed)
Follow up for ultrasound as scheduled 

## 2017-12-29 LAB — C. TRACHOMATIS/N. GONORRHOEAE RNA
C. trachomatis RNA, TMA: NOT DETECTED
N. gonorrhoeae RNA, TMA: NOT DETECTED

## 2017-12-30 ENCOUNTER — Encounter: Payer: Self-pay | Admitting: Gynecology

## 2017-12-30 LAB — URINE CULTURE
MICRO NUMBER: 90681035
SPECIMEN QUALITY: ADEQUATE

## 2017-12-30 LAB — URINALYSIS, COMPLETE W/RFL CULTURE
BILIRUBIN URINE: NEGATIVE
Bacteria, UA: NONE SEEN /HPF
GLUCOSE, UA: NEGATIVE
HGB URINE DIPSTICK: NEGATIVE
Hyaline Cast: NONE SEEN /LPF
KETONES UR: NEGATIVE
Leukocyte Esterase: NEGATIVE
NITRITES URINE, INITIAL: NEGATIVE
PROTEIN: NEGATIVE
Specific Gravity, Urine: 1.022 (ref 1.001–1.03)
pH: 5.5 (ref 5.0–8.0)

## 2017-12-30 LAB — PAP IG AND HPV HIGH-RISK: HPV DNA HIGH RISK: DETECTED — AB

## 2017-12-30 LAB — CULTURE INDICATED

## 2018-01-11 ENCOUNTER — Encounter: Payer: Self-pay | Admitting: Gynecology

## 2018-01-11 ENCOUNTER — Ambulatory Visit: Payer: BLUE CROSS/BLUE SHIELD | Admitting: Gynecology

## 2018-01-11 VITALS — BP 120/74

## 2018-01-11 DIAGNOSIS — R87610 Atypical squamous cells of undetermined significance on cytologic smear of cervix (ASC-US): Secondary | ICD-10-CM

## 2018-01-11 DIAGNOSIS — R87810 Cervical high risk human papillomavirus (HPV) DNA test positive: Secondary | ICD-10-CM | POA: Diagnosis not present

## 2018-01-11 NOTE — Progress Notes (Signed)
    Heather Nicholson 06/28/1986 [id number]        32 y.o.  G0P0000 presents for colposcopy.  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  Unsure whether she had any abnormal Pap smears before but has never had treatment rendered.  Past medical history,surgical history, problem list, medications, allergies, family history and social history were all reviewed and documented in the EPIC chart.  Directed ROS with pertinent positives and negatives documented in the history of present illness/assessment and plan.  Exam: Kennon PortelaKim Gardner assistant Vitals:   01/11/18 1400  BP: 120/74   General appearance:  Normal Abdomen soft nontender without masses guarding rebound Pelvic external BUS vagina normal.  Cervix normal.  Bimanual without masses or tenderness.  Physical Exam  Genitourinary:       Colposcopy performed after acetic acid cleanse is adequate and normal.  ECC performed.  Patient tolerated well.  Assessment/Plan:  32 y.o. 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  The patient I discussed the whole issue of dysplasia, high-grade/low-grade, progression/regression and the HPV association.  Patient will follow-up for ECC results.  Assuming negative or low-grade then plan repeat Pap smear in 1 year.  If otherwise then will triaged based upon results.  Patient has a follow-up appointment with me in a month for her ultrasound and will follow-up for that also.    Dara Lordsimothy P Faye Sanfilippo MD, 2:27 PM 01/11/2018

## 2018-01-11 NOTE — Patient Instructions (Signed)
Office will call you with biopsy results 

## 2018-01-13 ENCOUNTER — Encounter: Payer: Self-pay | Admitting: Gynecology

## 2018-01-13 LAB — TISSUE SPECIMEN

## 2018-01-13 LAB — PATHOLOGY

## 2018-02-06 ENCOUNTER — Ambulatory Visit: Payer: BLUE CROSS/BLUE SHIELD | Admitting: Gynecology

## 2018-02-06 ENCOUNTER — Ambulatory Visit (INDEPENDENT_AMBULATORY_CARE_PROVIDER_SITE_OTHER): Payer: BLUE CROSS/BLUE SHIELD

## 2018-02-06 ENCOUNTER — Encounter: Payer: Self-pay | Admitting: Gynecology

## 2018-02-06 VITALS — BP 118/76

## 2018-02-06 DIAGNOSIS — R102 Pelvic and perineal pain: Secondary | ICD-10-CM

## 2018-02-06 NOTE — Progress Notes (Signed)
    Heather Nicholson 07/02/1986 [id number]        32 y.o.  G0P0000 presents for ultrasound.  Has a history of left-sided pain coming and going over the past several years but seems to be getting worse.  Had CT scan December 2018 which was negative.  Also had a history of left ovarian cyst in the past.  Having intermittent dyspareunia but not consistent with every coital episode.  Recent exam was normal with negative STD screening.  Past medical history,surgical history, problem list, medications, allergies, family history and social history were all reviewed and documented in the EPIC chart.  Directed ROS with pertinent positives and negatives documented in the history of present illness/assessment and plan.  Exam: Vitals:   02/06/18 1231  BP: 118/76   General appearance:  Normal  Ultrasound transvaginal shows uterus normal size and echotexture.  Endometrial echo 1.0 mm.  Right and left ovaries grossly normal.  Ovarian volume generous at 12.9 cc right ovary and 14.3 cc left ovary.  Numerous small follicles also noted bilaterally.  Cul-de-sac negative.  Assessment/Plan:  32 y.o. G0P0000 with history of lower pelvic pain and prior ovarian cyst.  Ultrasound today is normal with generous ovaries.  Having regular monthly menses.  Reviewed etiology for abdominopelvic pain to include GI.  Not having chronic urinary type symptoms and had a negative urine analysis when evaluated in June.  She does note though over the last several days a pelvic heaviness after voiding no frequency dysuria urgency symptoms.  Will check urine analysis today.  Recommend that she try probiotics to see if this does not help with her pain.  If significant pain would continue then laparoscopy possibilities were also reviewed.  At this point though the patient is comfortable with monitoring.  She is contemplating pregnancy trial over the next year or so.  Multivitamin with folic acid supplementation recommended.    Dara Lordsimothy P  Yurem Viner MD, 12:46 PM 02/06/2018

## 2018-02-06 NOTE — Patient Instructions (Signed)
Try over-the-counter probiotic to see if that does not help with the pelvic pain.  Stay on a multivitamin with folic acid such as a prenatal vitamin as starting this before you get pregnant helps to decrease certain birth defects.

## 2018-02-08 ENCOUNTER — Other Ambulatory Visit: Payer: Self-pay

## 2018-02-08 LAB — URINALYSIS, COMPLETE W/RFL CULTURE
BACTERIA UA: NONE SEEN /HPF
BILIRUBIN URINE: NEGATIVE
Glucose, UA: NEGATIVE
Hyaline Cast: NONE SEEN /LPF
Ketones, ur: NEGATIVE
NITRITES URINE, INITIAL: NEGATIVE
SPECIFIC GRAVITY, URINE: 1.016 (ref 1.001–1.03)
pH: 7 (ref 5.0–8.0)

## 2018-02-08 LAB — URINE CULTURE
MICRO NUMBER:: 90840255
SPECIMEN QUALITY:: ADEQUATE

## 2018-02-08 LAB — CULTURE INDICATED

## 2018-02-08 MED ORDER — SULFAMETHOXAZOLE-TRIMETHOPRIM 800-160 MG PO TABS: 1.0000 | ORAL_TABLET | Freq: Two times a day (BID) | ORAL | 0 refills | Status: DC

## 2019-04-16 ENCOUNTER — Encounter: Payer: Self-pay | Admitting: Gynecology

## 2022-02-01 ENCOUNTER — Other Ambulatory Visit: Payer: Self-pay

## 2022-02-01 ENCOUNTER — Emergency Department (HOSPITAL_COMMUNITY)
Admission: EM | Admit: 2022-02-01 | Discharge: 2022-02-01 | Disposition: A | Discharge status: Home / Self Care | Payer: BC Managed Care – PPO | Attending: Emergency Medicine | Admitting: Emergency Medicine

## 2022-02-01 ENCOUNTER — Other Ambulatory Visit (HOSPITAL_COMMUNITY): Payer: Self-pay

## 2022-02-01 ENCOUNTER — Encounter (HOSPITAL_COMMUNITY): Payer: Self-pay

## 2022-02-01 DIAGNOSIS — G43111 Migraine with aura, intractable, with status migrainosus: Secondary | ICD-10-CM

## 2022-02-01 DIAGNOSIS — G43101 Migraine with aura, not intractable, with status migrainosus: Secondary | ICD-10-CM | POA: Diagnosis not present

## 2022-02-01 DIAGNOSIS — Z91040 Latex allergy status: Secondary | ICD-10-CM | POA: Diagnosis not present

## 2022-02-01 DIAGNOSIS — H9201 Otalgia, right ear: Secondary | ICD-10-CM | POA: Insufficient documentation

## 2022-02-01 DIAGNOSIS — R519 Headache, unspecified: Secondary | ICD-10-CM | POA: Diagnosis present

## 2022-02-01 MED ORDER — DIPHENHYDRAMINE HCL 50 MG/ML IJ SOLN
25.0000 mg | Freq: Once | INTRAMUSCULAR | Status: AC
  Administered 2022-02-01: 25 mg via INTRAVENOUS
  Filled 2022-02-01: qty 1

## 2022-02-01 MED ORDER — BUTALBITAL-APAP-CAFFEINE 50-325-40 MG PO TABS
1.0000 | ORAL_TABLET | Freq: Four times a day (QID) | ORAL | 0 refills | Status: DC | PRN
  Filled 2022-02-01 – 2022-02-02 (×2): qty 30, 4d supply, fill #0

## 2022-02-01 MED ORDER — LACTATED RINGERS IV BOLUS
1000.0000 mL | Freq: Once | INTRAVENOUS | Status: AC
  Administered 2022-02-01: 1000 mL via INTRAVENOUS

## 2022-02-01 MED ORDER — METOCLOPRAMIDE HCL 5 MG/ML IJ SOLN
10.0000 mg | Freq: Once | INTRAMUSCULAR | Status: AC
  Administered 2022-02-01: 10 mg via INTRAVENOUS
  Filled 2022-02-01: qty 2

## 2022-02-01 MED ORDER — KETOROLAC TROMETHAMINE 15 MG/ML IJ SOLN
15.0000 mg | Freq: Once | INTRAMUSCULAR | Status: AC
  Administered 2022-02-01: 15 mg via INTRAVENOUS
  Filled 2022-02-01: qty 1

## 2022-02-01 MED ORDER — PROMETHAZINE HCL 25 MG PO TABS
25.0000 mg | ORAL_TABLET | Freq: Four times a day (QID) | ORAL | 0 refills | Status: DC | PRN
  Filled 2022-02-01: qty 30, 8d supply, fill #0

## 2022-02-01 MED ORDER — OXYCODONE-ACETAMINOPHEN 5-325 MG PO TABS
1.0000 | ORAL_TABLET | Freq: Once | ORAL | Status: AC
  Administered 2022-02-01: 1 via ORAL
  Filled 2022-02-01: qty 1

## 2022-02-01 MED ORDER — DEXAMETHASONE SODIUM PHOSPHATE 10 MG/ML IJ SOLN
10.0000 mg | Freq: Once | INTRAMUSCULAR | Status: AC
  Administered 2022-02-01: 10 mg via INTRAVENOUS
  Filled 2022-02-01: qty 1

## 2022-02-01 NOTE — ED Provider Notes (Signed)
[facility] EMERGENCY DEPARTMENT Provider Note   CSN: [id number] Arrival date & time: 02/01/22  6606     History  Chief Complaint  Patient presents with   Headache    Heather Nicholson is a 36 y.o. female with history of migraines who presents to the ED for evaluation of worsening migraines with atypical symptoms.  Patient states that her migraines usually are throbbing, unilateral with auras.  While she is also experiencing these, she is also having pain that seems to originate in her right ear and radiates down the right side of her neck, across her right forehead and down her right jaw.  She also has been experiencing intermittent vertigo along with nausea.  She has a triptan medication that she takes at home, however this has not been improving her migraines.  She saw her PCP on Friday who prescribed her famotidine and meclizine which she has been taking without improvement.  She also told patient that she saw that there was a "nerve throbbing" visible in the right ear which is causing her pain.  Patient has occasionally tried ibuprofen or Excedrin, however these do not usually work for her anymore.  She also reports that she was recently diagnosed with an ear infection on the right side around 01/12/2022, little over 3 weeks ago.  This had resolved with antibiotics and she was symptom-free until about a week ago and her current symptoms started.  She denies fever, chills, weakness, numbness, tingling, abdominal pain, vomiting, diarrhea.   Headache Associated symptoms: ear pain, nausea and photophobia   Associated symptoms: no abdominal pain and no vomiting        Home Medications Prior to Admission medications   Medication Sig Start Date End Date Taking? Authorizing Provider  albuterol (PROVENTIL) (5 MG/ML) 0.5% nebulizer solution Take 2.5 mg by nebulization every 6 (six) hours as needed for wheezing or shortness of breath.    [provider]   butalbital-aspirin-caffeine Benny Lennert) 50-325-40 MG capsule Take 1 capsule by mouth 2 (two) times daily as needed for headache.    [provider]  Fluticasone Furoate-Vilanterol (BREO ELLIPTA IN) Inhale into the lungs.    [provider]  montelukast (SINGULAIR) 10 MG tablet Take 10 mg by mouth at bedtime.    [provider]  ondansetron (ZOFRAN) 4 MG tablet Take 1 tablet (4 mg total) by mouth every 6 (six) hours. 07/10/17   Hedges, Tinnie Gens, PA-C  sulfamethoxazole-trimethoprim (BACTRIM DS,SEPTRA DS) 800-160 MG tablet Take 1 tablet by mouth 2 (two) times daily. 02/08/18   Fontaine, Nadyne Coombes, MD      Allergies    Latex, Sulfa antibiotics, and Citrus    Review of Systems   Review of Systems  HENT:  Positive for ear pain.   Eyes:  Positive for photophobia and visual disturbance.  Respiratory:  Negative for shortness of breath.   Gastrointestinal:  Positive for nausea. Negative for abdominal pain and vomiting.  Neurological:  Positive for headaches.    Physical Exam Updated Vital Signs BP 126/73   Pulse (!) 56   Temp 98 F (36.7 C) (Oral)   Resp (!) 23   Ht 5\' 2"  (1.575 m)   Wt 101.6 kg   SpO2 99%   BMI 40.97 kg/m  Physical Exam Vitals and nursing note reviewed.  Constitutional:      General: She is not in acute distress.    Appearance: She is not ill-appearing.  HENT:     Head: Atraumatic.  Right Ear: Tympanic membrane, ear canal and external ear normal. No mastoid tenderness.     Left Ear: Tympanic membrane, ear canal and external ear normal. No mastoid tenderness.  Eyes:     Extraocular Movements: Extraocular movements intact.     Conjunctiva/sclera: Conjunctivae normal.     Pupils: Pupils are equal, round, and reactive to light.  Cardiovascular:     Rate and Rhythm: Normal rate and regular rhythm.     Pulses: Normal pulses.     Heart sounds: No murmur heard. Pulmonary:     Effort: Pulmonary effort is normal. No respiratory distress.      Breath sounds: Normal breath sounds.  Abdominal:     General: Abdomen is flat. There is no distension.     Palpations: Abdomen is soft.     Tenderness: There is no abdominal tenderness.  Musculoskeletal:        General: Normal range of motion.     Cervical back: Normal range of motion.  Skin:    General: Skin is warm and dry.     Capillary Refill: Capillary refill takes less than 2 seconds.  Neurological:     General: No focal deficit present.     Mental Status: She is alert.     Motor: No weakness.     Comments: Speech is clear, able to follow commands CN III-XII intact Normal strength in upper and lower extremities bilaterally including dorsiflexion and plantar flexion, strong and equal grip strength  Moves extremities without ataxia, coordination intact     Psychiatric:        Mood and Affect: Mood normal.     ED Results / Procedures / Treatments   Labs (all labs ordered are listed, but only abnormal results are displayed) Labs Reviewed - No data to display  EKG None  Radiology No results found.  Procedures Procedures    Medications Ordered in ED Medications  lactated ringers bolus 1,000 mL (1,000 mLs Intravenous New Bag/Given 02/01/22 1306)  dexamethasone (DECADRON) injection 10 mg (10 mg Intravenous Given 02/01/22 1302)  metoCLOPramide (REGLAN) injection 10 mg (10 mg Intravenous Given 02/01/22 1301)  diphenhydrAMINE (BENADRYL) injection 25 mg (25 mg Intravenous Given 02/01/22 1259)  ketorolac (TORADOL) 15 MG/ML injection 15 mg (15 mg Intravenous Given 02/01/22 1304)    ED Course/ Medical Decision Making/ A&P                           Medical Decision Making Risk Prescription drug management.   Social determinants of health:  Social History   Socioeconomic History   Marital status: Single    Spouse name: Not on file   Number of children: Not on file   Years of education: Not on file   Highest education level: Not on file  Occupational History    Not on file  Tobacco Use   Smoking status: Never   Smokeless tobacco: Never  Vaping Use   Vaping Use: Never used  Substance and Sexual Activity   Alcohol use: Yes    Comment: Social   Drug use: No   Sexual activity: Yes    Birth control/protection: None    Comment: 1st intercourse16 yo-More than 5 partners  Other Topics Concern   Not on file  Social History Narrative   Not on file   Social Determinants of Health   Financial Resource Strain: Not on file  Food Insecurity: Not on file  Transportation Needs: Not on file  Physical Activity: Not on file  Stress: Not on file  Social Connections: Not on file  Intimate Partner Violence: Not on file     Initial impression:  This patient presents to the ED for concern of right ear pain in the setting of intractable migraine, this involves an extensive number of treatment options, and is a complaint that carries with it a high risk of complications and morbidity.   Differentials include intractable migraine, AOM, EOM, trigeminal neuralgia.   Comorbidities affecting care:  Migraines  Additional history obtained: None  Medicines ordered and prescription drug management:  I ordered medication including: 1 L LR bolus Decadron 10 mg Reglan 10 mg Benadryl 25 mg Toradol 15 mg IV Reevaluation of the patient after these medicines showed that the patient resolved I have reviewed the patients home medicines and have made adjustments as needed   ED Course/Re-evaluation: Presents in no acute distress, nontoxic-appearing.  Vitals are without significant abnormality.  Exam was overall benign, no focal neurodeficits.  No weakness noted.  Patient treated with migraine cocktail with resolution of symptoms. Additionally, the distribution of her symptoms today is somewhat concerning for trigeminal neuralgia. Given this and with how frequent her migraines have been occurring, I will provide neurology follow-up.  Patient expresses understanding  and amenable to plan. Disposition:  After consideration of the diagnostic results, physical exam, history and the patients response to treatment feel that the patent would benefit from discharge with neuro follow-up.   Migraine with aura: Plan and management as described above. Discharged home in good condition. Final Clinical Impression(s) / ED Diagnoses Final diagnoses:  Migraine with aura and with status migrainosus, not intractable    Rx / DC Orders ED Discharge Orders          Ordered    Ambulatory referral to Neurology       Comments: An appointment is requested in approximately: soonest available   02/01/22 1358              Delight Ovens 02/01/22 1402    Terrilee Files, MD 02/01/22 1734

## 2022-02-01 NOTE — ED Triage Notes (Signed)
Pt arrived POV from home c/o a headache and right ear pain. Pt states she was seen by her PCP and they gave her medicine for this because they told her it was a nerve pulsating in her head.

## 2022-02-01 NOTE — Discharge Instructions (Addendum)
Given how frequent your migraines have been occurring and they are not responding to your prescription migraine medications, I have given you a referral to a neurologist to help manage your recurrent migraines.  I have also sent you in a refill for your promethazine and a new medication called Fioricet that you can take to help alleviate your headache.

## 2022-02-01 NOTE — ED Provider Triage Note (Signed)
Emergency Medicine Provider Triage Evaluation Note  Heather Nicholson , a 36 y.o. female  was evaluated in triage.  Patient has a history of migraines.  She comes in today saying that she has been having an increase in the frequency of her headaches since Friday.  She was seen by her primary care because she is having ear fullness.  Her primary care said that there was a "nerve throbbing problem."  She was started on meclizine.  Is frustrated because she is having difficulty sleeping on her right side  She was told if she continues to have symptoms she will need to see a specialist.  Review of Systems  Positive: Headaches Negative: Blurred vision, weakness  Physical Exam  BP 127/81 (BP Location: Left Arm)   Pulse 71   Temp 98.1 F (36.7 C)   Resp 15   SpO2 99%  Gen:   Awake, no distress   Resp:  Normal effort  MSK:   Moves extremities without difficulty  Other:  Well-appearing, resting comfortably.  Normal ear exam bilaterally, PERRLA  Medical Decision Making  Medically screening exam initiated at 10:01 AM.  Appropriate orders placed.  Abbiegail Landgren was informed that the remainder of the evaluation will be completed by another provider, this initial triage assessment does not replace that evaluation, and the importance of remaining in the ED until their evaluation is complete.       Saddie Benders, PA-C 02/01/22 1002

## 2022-02-02 ENCOUNTER — Other Ambulatory Visit (HOSPITAL_COMMUNITY): Payer: Self-pay

## 2023-10-23 NOTE — Progress Notes (Unsigned)
   Heather Nicholson, female    DOB: [birth year]    MRN: [id number]   Brief patient profile:  ***  yo*** *** referred to pulmonary clinic in [place]  10/26/2023 by *** for ***      History of Present Illness  10/26/2023  Pulmonary/ 1st office eval/ Sherene Sires / [facility]  No chief complaint on file.    Dyspnea:  *** Cough: *** Sleep: *** SABA use: *** 02: *** LDSCT:***  No obvious day to day or daytime pattern/variability or assoc excess/ purulent sputum or mucus plugs or hemoptysis or cp or chest tightness, subjective wheeze or overt sinus or hb symptoms.    Also denies any obvious fluctuation of symptoms with weather or environmental changes or other aggravating or alleviating factors except as outlined above   No unusual exposure hx or h/o childhood pna/ asthma or knowledge of premature birth.  Current Allergies, Complete Past Medical History, Past Surgical History, Family History, and Social History were reviewed in [employer] record.  ROS  The following are not active complaints unless bolded Hoarseness, sore throat, dysphagia, dental problems, itching, sneezing,  nasal congestion or discharge of excess mucus or purulent secretions, ear ache,   fever, chills, sweats, unintended wt loss or wt gain, classically pleuritic or exertional cp,  orthopnea pnd or arm/hand swelling  or leg swelling, presyncope, palpitations, abdominal pain, anorexia, nausea, vomiting, diarrhea  or change in bowel habits or change in bladder habits, change in stools or change in urine, dysuria, hematuria,  rash, arthralgias, visual complaints, headache, numbness, weakness or ataxia or problems with walking or coordination,  change in mood or  memory.            Outpatient Medications Prior to Visit  Medication Sig Dispense Refill   albuterol (PROVENTIL) (5 MG/ML) 0.5% nebulizer solution Take 2.5 mg by nebulization every 6 (six) hours as needed for wheezing or shortness of breath.      butalbital-acetaminophen-caffeine (FIORICET) 50-325-40 MG tablet Take 1-2 tablets by mouth every 6 (six) hours as needed for headache. 30 tablet 0   butalbital-aspirin-caffeine (FIORINAL) 50-325-40 MG capsule Take 1 capsule by mouth 2 (two) times daily as needed for headache.     Fluticasone Furoate-Vilanterol (BREO ELLIPTA IN) Inhale into the lungs.     montelukast (SINGULAIR) 10 MG tablet Take 10 mg by mouth at bedtime.     ondansetron (ZOFRAN) 4 MG tablet Take 1 tablet (4 mg total) by mouth every 6 (six) hours. 12 tablet 0   promethazine (PHENERGAN) 25 MG tablet Take 1 tablet (25 mg total) by mouth every 6 (six) hours as needed for nausea or vomiting. 30 tablet 0   sulfamethoxazole-trimethoprim (BACTRIM DS,SEPTRA DS) 800-160 MG tablet Take 1 tablet by mouth 2 (two) times daily. 6 tablet 0   No facility-administered medications prior to visit.    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         Assessment   No problem-specific Assessment & Plan notes found for this encounter.     Sandrea Hughs, MD 10/23/2023

## 2023-10-26 ENCOUNTER — Encounter: Payer: Self-pay | Admitting: Internal Medicine

## 2023-10-26 ENCOUNTER — Ambulatory Visit: Admitting: Internal Medicine

## 2023-10-26 VITALS — BP 126/77 | HR 76 | Ht 62.0 in | Wt 251.0 lb

## 2023-10-26 DIAGNOSIS — Z6841 Body Mass Index (BMI) 40.0 and over, adult: Secondary | ICD-10-CM | POA: Insufficient documentation

## 2023-10-26 DIAGNOSIS — J302 Other seasonal allergic rhinitis: Secondary | ICD-10-CM | POA: Insufficient documentation

## 2023-10-26 DIAGNOSIS — J45991 Cough variant asthma: Secondary | ICD-10-CM | POA: Diagnosis not present

## 2023-10-26 DIAGNOSIS — J4520 Mild intermittent asthma, uncomplicated: Secondary | ICD-10-CM | POA: Insufficient documentation

## 2023-10-26 DIAGNOSIS — J45909 Unspecified asthma, uncomplicated: Secondary | ICD-10-CM | POA: Insufficient documentation

## 2023-10-26 DIAGNOSIS — G43019 Migraine without aura, intractable, without status migrainosus: Secondary | ICD-10-CM | POA: Insufficient documentation

## 2023-10-26 MED ORDER — BUDESONIDE-FORMOTEROL FUMARATE 80-4.5 MCG/ACT IN AERO: INHALATION_SPRAY | RESPIRATORY_TRACT | 12 refills | Status: DC

## 2023-10-26 MED ORDER — PANTOPRAZOLE SODIUM 40 MG PO TBEC: 40.0000 mg | DELAYED_RELEASE_TABLET | Freq: Every day | ORAL | 2 refills | Status: DC

## 2023-10-26 MED ORDER — ACETAMINOPHEN-CODEINE 300-30 MG PO TABS: 1.0000 | ORAL_TABLET | ORAL | 0 refills | Status: AC | PRN

## 2023-10-26 NOTE — Patient Instructions (Addendum)
 Stop BREO (it is likely making your cough worse)   Pantoprazole 40mg  Take 30- 60 min before your first and last meals of the day   For nasal congestion  >>  Mucinex D (not DM)  every 12 hours (sign for it) as needed   For  cough >>> Take delsym two tsp every 12 hours and supplement if needed with  Tylenol #3   up to 1-2 every 4 hours to suppress the urge to cough. Swallowing water and/or using ice chips/non mint and menthol containing candies (such as lifesavers or sugarless jolly ranchers) are also effective.  You should rest your voice and avoid activities that you know make you cough.  Once you have eliminated the cough for 3 straight days try reducing the Tylenol #3 first,  then the delsym as tolerated.    Finish your prednisone (not as needed)   Take albuterol nebulizer with budesonide 1st thing in am and before bedtime  Once not coughing anymore ok to start back Symbicort (Breyna) 80  Take 2 puffs first thing in am and then another 2 puffs about 12 hours later.   Work on inhaler technique:  relax and gently blow all the way out then take a nice smooth full deep breath back in, triggering the inhaler at same time you start breathing in.  Hold breath in for at least  5 seconds if you can. Blow out symbicort (breyna)  thru nose. Rinse and gargle with water when done.  If mouth or throat bother you at all,  try brushing teeth/gums/tongue with arm and hammer toothpaste/ make a slurry and gargle and spit out.      Please schedule a follow up office visit in 4 weeks, call sooner if needed with all medications /inhalers/ solutions in hand so we can verify exactly what you are taking. This includes all medications from all doctors and over the counters - PLEASE separate them into two bags:  the ones you take automatically, no matter what, vs the ones you take just when you feel you need them "BAG #2 is UP TO YOU"  - this will really help [place] help you take your medications more effectively.

## 2023-10-26 NOTE — Assessment & Plan Note (Signed)
 Body mass index is 45.91 kg/m.  -   No results found for: "TSH"    Contributing to doe and risk of GERD/dvt/PE mimicking asthma  >>>   reviewed the need and the process to achieve and maintain neg calorie balance > defer f/u primary care including intermittently monitoring thyroid status            Each maintenance medication was reviewed in detail including emphasizing most importantly the difference between maintenance and prns and under what circumstances the prns are to be triggered using an action plan format where appropriate.  Total time for H and P, chart review, counseling, reviewing hfa/neb device(s) and generating customized AVS unique to this office visit / same day charting = 64 min for new pt with  multiple  refractory respiratory  symptoms of uncertain etiology

## 2023-10-26 NOTE — Assessment & Plan Note (Signed)
 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   These are the same pts (now being labeled as having "irritable larynx syndrome" by some cough centers) who not infrequently have a history of having failed to tolerate ace inhibitors,  dry powder inhalers or biphosphonates or report having atypical/extraesophageal reflux symptoms that don't respond to standard doses of PPI  and are easily confused as having aecopd or asthma flares by even experienced allergists/ pulmonologists (myself included).   Of the three most common causes of  Sub-acute / recurrent or chronic cough, only one (GERD)  can actually contribute to/ trigger  the other two (asthma and post nasal drip syndrome)  and perpetuate the cylce of cough.  While not intuitively obvious, many patients with chronic low grade reflux do not cough until there is a primary insult that disturbs the protective epithelial barrier and exposes sensitive nerve endings.   This is typically viral but can due to PNDS and  either may apply here.     >>>  The point is that once this occurs, it is difficult to eliminate the cycle  using anything but a maximally effective acid suppression regimen at least in the short run, accompanied by an appropriate diet to address non acid GERD and  control / eliminate the cough itself for at least 3 days with tyl #3 and suppress asthma with neb alb/bud the equivalent of air supra at least until cough is gone then start back on low dose ICS/laba when no longer coughing   F/u 4 weeks with all meds in hand using a trust but verify approach to confirm accurate Medication  Reconciliation The principal here is that until we are certain that the  patients are doing what we've asked, it makes no sense to ask them to do more.

## 2023-11-23 NOTE — Progress Notes (Signed)
 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    History of Present Illness breo each am / budesonide  1x 3  days  10/26/2023  Pulmonary/ 1st office eval/ Neiko Trivedi / [place] [facility]  Dyspnea:  lots of walking work/ problem with steps  Cough: each am > mucus whitish yellow with nasal discharge  Sleep: bed is flat /  typically with 6 pillows  or prone SABA use: hfa  4 x5 per day and at night  02: none  Rec Stop BREO (it is likely making your cough worse)  Pantoprazole  40mg  Take 30- 60 min before your first and last meals of the day  For nasal congestion  >>  Mucinex D (not DM)  every 12 hours (sign for it) as needed  For  cough >>> Take delsym two tsp every 12 hours and supplement if needed with  Tylenol  #3   up to 1-2 every 4 hours to suppress the urge to cough.  Once you have eliminated the cough for 3 straight days try reducing the Tylenol  #3 first,  then the delsym as tolerated.   Finish your prednisone  (not as needed)  Take albuterol  nebulizer with budesonide  1st thing in am and before bedtime Once not coughing anymore ok to start back Symbicort  (Breyna ) 80  Take 2 puffs first thing in am and then another 2 puffs about 12 hours later.  Work on inhaler technique:     Please schedule a follow up office visit in 4 weeks, call sooner if needed with all medications /inhalers/ solutions in hand    11/24/2023  f/u ov/[place] office/Tyaira Heward re: cough variant asthma  maint on symbicort  80  did  bring meds   Chief Complaint  Patient presents with   Cough  Dyspnea:  walking at work ok / steps easier  Cough: much better/ still some sensation of globus   Sleeping: bed is flat with 4 pillows  occ waking up  "gasping"  x > one year  SABA use: much less  02: none    No obvious day to day or daytime variability or assoc excess/ purulent sputum or mucus plugs or hemoptysis or cp or chest tightness, subjective wheeze or overt sinus or hb symptoms.    Also denies any obvious fluctuation of symptoms with weather or environmental changes or other aggravating or alleviating factors except as outlined above   No unusual exposure hx or h/o childhood pna/ asthma or knowledge of premature birth.  Current Allergies, Complete Past Medical History, Past Surgical History, Family History, and Social History were reviewed in [employer] record.  ROS  The following are not active complaints unless bolded Hoarseness, sore throat/globus with mild, dysphagia, dental problems, itching, sneezing,  nasal congestion or discharge of excess mucus or purulent secretions, ear ache,   fever, chills, sweats, unintended wt loss or wt gain, classically pleuritic or exertional cp,  orthopnea pnd or arm/hand swelling  or leg swelling, presyncope, palpitations, abdominal pain, anorexia, nausea, vomiting, diarrhea  or change in bowel habits or change in bladder habits, change in stools or change in urine, dysuria, hematuria,  rash, arthralgias, visual complaints, headache, numbness, weakness or ataxia or problems with walking or coordination,  change in mood or  memory.        Current Meds  Medication Sig   albuterol  (ACCUNEB ) 0.63 MG/3ML nebulizer solution Take 1 ampule by nebulization every 6 (six) hours as needed.   albuterol  (VENTOLIN  HFA) 108 (90 Base) MCG/ACT inhaler Inhale 2 puffs into the lungs every 6 (six) hours as needed.   budesonide  (PULMICORT ) 1 MG/2ML nebulizer solution Take 2 mLs by nebulization  every 12 (twelve) hours as needed.   budesonide -formoterol  (SYMBICORT ) 80-4.5 MCG/ACT inhaler Take 2 puffs first thing in am and then another 2 puffs about 12 hours later.   [DISCONTINUED] metFORMIN (GLUCOPHAGE) 500 MG tablet Take 500 mg by mouth 2 (two) times daily with a meal.             Past Medical History:  Diagnosis Date   ASCUS with positive high risk HPV cervical 12/2017   Colposcopy negative with negative ECC   Asthma    Migraine       Objective:    Wt Readings from Last 3 Encounters:  11/24/23 253 lb (114.8 kg)  10/26/23 251 lb (113.9 kg)  02/01/22 224 lb (101.6 kg)    Vital signs reviewed  11/24/2023  - Note at rest 02 sats  96% on RA   General appearance:    amb MO (by BMI) amb  bf nad    HEENT : Oropharynx  clear/ M3 airway    Nasal turbinates mod edema   NECK :  without  apparent JVD/ palpable Nodes/TM    LUNGS: no acc muscle use,  Nl contour chest which is clear to A and P bilaterally without cough on insp or exp maneuvers   CV:  RRR  no s3 or murmur or increase in P2, and no edema   ABD:  obese soft and nontender   MS:   ext warm without deformities Or obvious joint restrictions  calf tenderness, cyanosis or clubbing    SKIN: warm and dry without lesions    NEURO:  alert, approp, nl sensorium with  no motor or cerebellar deficits apparent.         Assessment

## 2023-11-24 ENCOUNTER — Ambulatory Visit: Admitting: Internal Medicine

## 2023-11-24 ENCOUNTER — Encounter: Payer: Self-pay | Admitting: Internal Medicine

## 2023-11-24 VITALS — BP 119/75 | HR 80 | Ht 62.0 in | Wt 253.0 lb

## 2023-11-24 DIAGNOSIS — J45991 Cough variant asthma: Secondary | ICD-10-CM | POA: Diagnosis not present

## 2023-11-24 MED ORDER — BUDESONIDE-FORMOTEROL FUMARATE 160-4.5 MCG/ACT IN AERO: INHALATION_SPRAY | RESPIRATORY_TRACT | 12 refills | Status: DC

## 2023-11-24 NOTE — Assessment & Plan Note (Signed)
 Onset around 2015 with prominent upper airway cough pattern and clear lungs on 10/26/2023  - hfa baseline 10/26/2023 around 0 > improved to 50% for hfa  - 10/26/2023 max gerd rx and cyclical cough rx with tyl #3 and bud/alb neb bid   >>>  11/24/2023  After extensive coaching inhaler device,  effectiveness =    80% (short ti) still having some breakthru symptoms at hs but not clear it's asthma vs vcd vs osa so rec symb increase to 160/gerd rx > f/u in 35m - Allergy screen 11/24/2023 >  Eos 0. /  IgE   > allergy eval if Pos vs ent/sleep if not          Each maintenance medication was reviewed in detail including emphasizing most importantly the difference between maintenance and prns and under what circumstances the prns are to be triggered using an action plan format where appropriate.  Total time for H and P, chart review, counseling, reviewing hfa device(s) and generating customized AVS unique to this office visit / same day charting =  > 33 min for multiple  refractory respiratory  symptoms of uncertain etiology

## 2023-11-24 NOTE — Patient Instructions (Addendum)
 Elevate headboard x 6 - 8  inches if possible   Increase the Symbicort  strength  to the 160 Take 2 puffs first thing in am and then another 2 puffs about 12 hours later on next refill    Please remember to go to the lab department   for your tests - we will call you with the results when they are available.     Please schedule a follow up visit in 3 months but call sooner if needed

## 2023-11-27 LAB — CBC WITH DIFFERENTIAL/PLATELET
Basophils Absolute: 0 10*3/uL (ref 0.0–0.2)
Basos: 1 %
EOS (ABSOLUTE): 0.2 10*3/uL (ref 0.0–0.4)
Eos: 3 %
Hematocrit: 35.9 % (ref 34.0–46.6)
Hemoglobin: 11.8 g/dL (ref 11.1–15.9)
Immature Grans (Abs): 0 10*3/uL (ref 0.0–0.1)
Immature Granulocytes: 0 %
Lymphocytes Absolute: 2.5 10*3/uL (ref 0.7–3.1)
Lymphs: 38 %
MCH: 28.5 pg (ref 26.6–33.0)
MCHC: 32.9 g/dL (ref 31.5–35.7)
MCV: 87 fL (ref 79–97)
Monocytes Absolute: 0.5 10*3/uL (ref 0.1–0.9)
Monocytes: 7 %
Neutrophils Absolute: 3.4 10*3/uL (ref 1.4–7.0)
Neutrophils: 51 %
Platelets: 335 10*3/uL (ref 150–450)
RBC: 4.14 x10E6/uL (ref 3.77–5.28)
RDW: 12.8 % (ref 11.7–15.4)
WBC: 6.6 10*3/uL (ref 3.4–10.8)

## 2023-11-27 LAB — IGE: IgE (Immunoglobulin E), Serum: 79 [IU]/mL (ref 6–495)

## 2024-02-21 NOTE — Progress Notes (Deleted)
 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    History of Present Illness breo each am / budesonide  1x 3  days  10/26/2023  Pulmonary/ 1st office eval/ Darel Ricketts / [place] [facility]  Dyspnea:  lots of walking work/ problem with steps  Cough: each am > mucus whitish yellow with nasal discharge  Sleep: bed is flat /  typically with 6 pillows  or prone SABA use: hfa  4 x5 per day and at night  02: none  Rec Stop BREO (it is likely making your cough worse)  Pantoprazole  40mg  Take 30- 60 min before your first and last meals of the day  For nasal congestion  >>  Mucinex D (not DM)  every 12 hours (sign for it) as needed  For  cough >>> Take delsym two tsp every 12 hours and supplement if needed with  Tylenol  #3   up to 1-2 every 4 hours to suppress the urge to cough.  Once you have eliminated the cough for 3 straight days try reducing the Tylenol  #3 first,  then the delsym as tolerated.   Finish your prednisone  (not as needed)  Take albuterol  nebulizer with budesonide  1st thing in am and before bedtime Once not coughing anymore ok to start back Symbicort  (Breyna ) 80  Take 2 puffs first thing in am and then another 2 puffs about 12 hours later.  Work on inhaler technique:     Please schedule a follow up office visit in 4 weeks, call sooner if needed with all medications /inhalers/ solutions in hand    11/24/2023  f/u ov/[place] office/Marisabel Macpherson re: cough variant asthma  maint on symbicort  80  did  bring meds   Chief Complaint  Patient presents with   Cough  Dyspnea:  walking at work ok / steps easier  Cough: much better/ still some sensation of globus   Sleeping: bed is flat with 4 pillows  occ waking up  gasping  x > one year  SABA use: much less  02: none  Rec     02/24/2024  f/u ov/[place] office/Shakila Mak re: *** maint on ***  No chief complaint on file.   Dyspnea:  *** Cough: *** Sleeping: ***   resp cc  SABA use: *** 02: ***  Lung cancer screening: ***   No obvious day to day or daytime variability or assoc excess/ purulent sputum or mucus plugs or hemoptysis or cp or chest tightness, subjective wheeze or overt sinus or hb symptoms.    Also denies any obvious fluctuation of symptoms with weather or environmental changes or other aggravating or alleviating factors except as outlined above   No unusual exposure hx or h/o childhood pna/ asthma or knowledge of premature birth.  Current Allergies, Complete Past Medical History, Past Surgical History, Family History, and Social History were reviewed in [employer] record.  ROS  The following are not active complaints unless bolded  Hoarseness, sore throat, dysphagia, dental problems, itching, sneezing,  nasal congestion or discharge of excess mucus or purulent secretions, ear ache,   fever, chills, sweats, unintended wt loss or wt gain, classically pleuritic or exertional cp,  orthopnea pnd or arm/hand swelling  or leg swelling, presyncope, palpitations, abdominal pain, anorexia, nausea, vomiting, diarrhea  or change in bowel habits or change in bladder habits, change in stools or change in urine, dysuria, hematuria,  rash, arthralgias, visual complaints, headache, numbness, weakness or ataxia or problems with walking or coordination,  change in mood or  memory.        No outpatient medications have been marked as taking for the 02/24/24 encounter (Appointment) with Jakeia Carreras B, MD.              Past  Medical History:  Diagnosis Date   ASCUS with positive high risk HPV cervical 12/2017   Colposcopy negative with negative ECC   Asthma    Migraine       Objective:    Wts  02/24/2024        ***   11/24/23 253 lb (114.8 kg)  10/26/23 251 lb (113.9 kg)  02/01/22 224 lb (101.6 kg)    Vital signs reviewed  02/24/2024  - Note at rest 02 sats  ***% on ***   General appearance:    ***  M3 airway***   t.         Assessment

## 2024-02-24 ENCOUNTER — Ambulatory Visit: Admitting: Internal Medicine

## 2024-03-08 ENCOUNTER — Ambulatory Visit: Admitting: Internal Medicine

## 2024-03-08 ENCOUNTER — Encounter: Payer: Self-pay | Admitting: Internal Medicine

## 2024-03-08 VITALS — BP 137/80 | HR 86 | Ht 62.0 in | Wt 252.8 lb

## 2024-03-08 DIAGNOSIS — Z6841 Body Mass Index (BMI) 40.0 and over, adult: Secondary | ICD-10-CM | POA: Diagnosis not present

## 2024-03-08 DIAGNOSIS — J45991 Cough variant asthma: Secondary | ICD-10-CM

## 2024-03-08 MED ORDER — BUDESONIDE-FORMOTEROL FUMARATE 80-4.5 MCG/ACT IN AERO: INHALATION_SPRAY | RESPIRATORY_TRACT | Status: DC

## 2024-03-08 NOTE — Patient Instructions (Addendum)
 Try symbicort  80  Take 2 puffs first thing in am and then another 2 puffs about 12 hours later.   Plan A = Automatic = Always=    Symbcort 80 Take 2 puffs first thing in am and then another 2 puffs about 12 hours later.    Work on inhaler technique:  relax and gently blow all the way out then take a nice smooth full deep breath back in, triggering the inhaler at same time you start breathing in.  Hold breath in for at least  5 seconds if you can. Blow out symbicort   thru nose. Rinse and gargle with water when done.  If mouth or throat bother you at all,  try brushing teeth/gums/tongue with arm and hammer toothpaste/ make a slurry and gargle and spit out.      Plan B = Backup (to supplement plan A, not to replace it) Use your albuterol  inhaler as a rescue medication to be used if you can't catch your breath by resting or slowing your pace  or doing a relaxed purse lip breathing pattern.  - The less you use it, the better it will work when you need it. - Ok to use the inhaler up to 2 puffs  every 4 hours if you must but call for appointment if use goes up over your usual need - Don't leave home without it !!  (think of it like the spare tire or starter fluid for your car)   Plan C = Crisis (instead of Plan B but only if Plan B stops working) - only use your albuterol  nebulizer if you first try Plan B and it fails to help > ok to use the nebulizer up to every 4 hours but if start needing it regularly call for immediate appointment   My office will be contacting you by phone for referral to [facility]   - if you don't hear back from my office within one week please call us  back or notify us  thru MyChart and we'll address it right away.

## 2024-03-08 NOTE — Progress Notes (Signed)
 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    History of Present Illness breo each am / budesonide  1x 3  days  10/26/2023  Pulmonary/ 1st office eval/ Heather Nicholson / [place] [facility]  Dyspnea:  lots of walking work/ problem with steps  Cough: each am > mucus whitish yellow with nasal discharge  Sleep: bed is flat /  typically with 6 pillows  or prone SABA use: hfa  4 x5 per day and at night  02: none  Rec Stop BREO (it is likely making your cough worse)  Pantoprazole  40mg  Take 30- 60 min before your first and last meals of the day  For nasal congestion  >>  Mucinex D (not DM)  every 12 hours (sign for it) as needed  For  cough >>> Take delsym two tsp every 12 hours and supplement if needed with  Tylenol  #3   up to 1-2 every 4 hours to suppress the urge to cough.  Once you have eliminated the cough for 3 straight days try reducing the Tylenol  #3 first,  then the delsym as tolerated.   Finish your prednisone  (not as needed)  Take albuterol  nebulizer with budesonide  1st thing in am and before bedtime Once not coughing anymore ok to start back Symbicort  (Breyna ) 80  Take 2 puffs first thing in am and then another 2 puffs about 12 hours later.  Work on inhaler technique:     Please schedule a follow up office visit in 4 weeks, call sooner if needed with all medications /inhalers/ solutions in hand    11/24/2023  f/u ov/[place] office/Heather Nicholson re: cough variant asthma  maint on symbicort  80  did  bring meds   Chief Complaint  Patient presents with   Cough  Dyspnea:  walking at work ok / steps easier  Cough: much better/ still some sensation of globus   Sleeping: bed is flat with 4 pillows  occ waking up  gasping  x > one year  SABA use: much less  02: none  Rec Elevate headboard x 6 - 8  inches if possible  Increase the Symbicort  strength  to the 160 Take 2 puffs first thing in am and then another 2 puffs about 12 hours later on next refill     Allergy screen 11/24/2023 >  Eos 0.2/  IgE  79    03/08/2024 3 m f/u ov/[place] office/Heather Nicholson re: cough variant asthma vs UACS  maint on symbicort  160 bid  and protonix  40 mg bid ac  Chief Complaint  Patient presents with   Asthma    F/u 40m Trouble breathing at night - thinks she could stop breathing sometimes   Dyspnea:  walking at work ok  Cough: still feels globus / clearing throat/ mild voice fatigue/ no excess mucus production   Sleeping: bed is flat/ 3 head pillows  SABA  neb q d  alb only / off pulmocort x  one month /  02: no   No obvious day to day or daytime variability or assoc excess/ purulent sputum or mucus plugs or hemoptysis or cp or chest tightness, subjective wheeze or overt sinus or hb symptoms.    Also denies any obvious fluctuation of symptoms with weather or environmental changes or other aggravating or alleviating factors except as outlined above   No unusual exposure hx or h/o childhood pna/ asthma or knowledge of premature birth.  Current Allergies, Complete Past Medical History, Past Surgical History, Family History, and Social History were reviewed in [employer] record.  ROS  The following are not active complaints unless bolded Hoarseness, sore throat, dysphagia, dental problems, itching, sneezing,  nasal congestion or discharge of excess mucus or purulent secretions, ear ache,   fever, chills, sweats, unintended wt loss or wt gain, classically pleuritic or exertional cp,  orthopnea pnd or  arm/hand swelling  or leg swelling, presyncope, palpitations, abdominal pain, anorexia, nausea, vomiting, diarrhea  or change in bowel habits or change in bladder habits, change in stools or change in urine, dysuria, hematuria,  rash, arthralgias, visual complaints, headache, numbness, weakness or ataxia or problems with walking or coordination,  change in mood or  memory.        Current Meds  Medication Sig   albuterol  (ACCUNEB ) 0.63 MG/3ML nebulizer solution Take 1 ampule by nebulization every 6 (six) hours as needed.   albuterol  (VENTOLIN  HFA) 108 (90 Base) MCG/ACT inhaler Inhale 2 puffs into the lungs every 6 (six) hours as needed.   budesonide  (PULMICORT ) 1 MG/2ML nebulizer solution Take 2 mLs by nebulization every 12 (twelve) hours as needed.   budesonide -formoterol  (SYMBICORT ) 160-4.5 MCG/ACT inhaler Take 2 puffs first thing in am and then another 2 puffs about 12 hours later.   pantoprazole  (PROTONIX ) 40 MG tablet Take 40 mg by mouth daily.              Past Medical History:  Diagnosis Date   ASCUS with positive high risk HPV cervical 12/2017   Colposcopy negative with negative ECC   Asthma    Migraine       Objective:    Wts  03/08/2024        252   11/24/23 253 lb (114.8 kg)  10/26/23 251 lb (113.9 kg)  02/01/22 224 lb (101.6 kg)    Vital signs reviewed  03/08/2024  - Note at rest 02 sats  97% on RA   General appearance:    MO (by BMI) amb bf with freq throat clearing     HEENT : Oropharynx  M3 airway s viz pnd/cobblestoning       Nasal turbinates nl    NECK :  without  apparent JVD/ palpable Nodes/TM    LUNGS: no acc muscle use,  Nl contour chest which is clear to A and P bilaterally without cough on insp or exp maneuvers   CV:  RRR  no s3 or murmur or increase in P2, and no edema   ABD:  quite obese soft and nontender   MS:  Gait nl   ext warm without deformities Or obvious joint restrictions  calf tenderness, cyanosis or clubbing    SKIN: warm and dry  without lesions    NEURO:  alert, approp, nl sensorium with  no motor or cerebellar deficits apparent.       Assessment    Assessment & Plan Cough variant asthma Onset around 2015 with prominent upper airway cough pattern  and clear lungs on 10/26/2023  - hfa baseline 10/26/2023 around 0 > improved to 50% for hfa  - 10/26/2023 max gerd rx and cyclical cough rx with tyl #3 and bud/alb neb bid  -  11/24/2023  After extensive coaching inhaler device,  effectiveness =    80% (short ti) still having some breakthru symptoms at hs but not clear it's asthma vs vcd vs osa so rec symb increase to 160/gerd rx > f/u in 57m - Allergy screen 11/24/2023 >  Eos 0.2/  IgE  79   - 03/08/2024 improved but still excess throat clearing/ globus >>>> no evidence wheeze/ breathing better so rec reduce symbicort  to 80 2bid   >>>> referred to ENT ? Gabapentin next?  Discussed in detail all the  indications, usual  risks and alternatives  relative to the benefits with patient who agrees to proceed with Rx as outlined.     Cough variant asthma vs UACS Onset around 2015 with prominent upper airway cough pattern and clear lungs on 10/26/2023  - hfa baseline 10/26/2023 around 0 > improved to 50% for hfa  - 10/26/2023 max gerd rx and cyclical cough rx with tyl #3 and bud/alb neb bid  -  11/24/2023  After extensive coaching inhaler device,  effectiveness =    80% (short ti) still having some breakthru symptoms at hs but not clear it's asthma vs vcd vs osa so rec symb increase to 160/gerd rx > f/u in 76m - Allergy screen 11/24/2023 >  Eos 0.2/  IgE  79   - 03/08/2024 improved but still excess throat clearing/ globus >>>> no evidence wheeze/ breathing better so rec reduce symbicort  to 80 2bid   >>>> referred to ENT ? Gabapentin next?  Discussed in detail all the  indications, usual  risks and alternatives  relative to the benefits with patient who agrees to proceed with Rx as outlined.     Morbid obesity (HCC) Body mass index is 46.24  kg/m.  -    No results found for: TSH    Contributing to doe and risk of GERD/dvt/ pe >>>   reviewed the need and the process to achieve and maintain neg calorie balance > defer f/u primary care including intermittently monitoring thyroid status    Assessment & Plan Cough variant asthma Onset around 2015 with prominent upper airway cough pattern and clear lungs on 10/26/2023  - hfa baseline 10/26/2023 around 0 > improved to 50% for hfa  - 10/26/2023 max gerd rx and cyclical cough rx with tyl #3 and bud/alb neb bid  -  11/24/2023  After extensive coaching inhaler device,  effectiveness =    80% (short ti) still having some breakthru symptoms at hs but not clear it's asthma vs vcd vs osa so rec symb increase to 160/gerd rx > f/u in 60m - Allergy screen 11/24/2023 >  Eos 0.2/  IgE  79   - 03/08/2024 improved but still excess throat clearing/ globus >>>> no evidence wheeze/ breathing better so rec reduce symbicort  to 80 2bid   >>>> referred to ENT ? Gabapentin next?  Discussed in detail all the  indications, usual  risks and alternatives  relative to the benefits with patient who agrees to proceed with Rx as outlined.     Cough variant asthma vs UACS Onset around 2015 with prominent upper airway cough pattern and clear lungs on 10/26/2023  - hfa baseline 10/26/2023 around 0 > improved to 50% for hfa  - 10/26/2023 max gerd  rx and cyclical cough rx with tyl #3 and bud/alb neb bid  -  11/24/2023  After extensive coaching inhaler device,  effectiveness =    80% (short ti) still having some breakthru symptoms at hs but not clear it's asthma vs vcd vs osa so rec symb increase to 160/gerd rx > f/u in 58m - Allergy screen 11/24/2023 >  Eos 0.2/  IgE  79   - 03/08/2024 improved but still excess throat clearing/ globus >>>> no evidence wheeze/ breathing better so rec reduce symbicort  to 80 2bid   >>>> referred to ENT ? Gabapentin next?  Discussed in detail all the  indications, usual  risks and alternatives  relative  to the benefits with patient who agrees to proceed with Rx as outlined.     Morbid obesity (HCC) Body mass index is 46.24 kg/m.  -    No results found for: TSH    Contributing to doe and risk of GERD/dvt/ pe >>>   reviewed the need and the process to achieve and maintain neg calorie balance > defer f/u primary care including intermittently monitoring thyroid status           Each maintenance medication was reviewed in detail including emphasizing most importantly the difference between maintenance and prns and under what circumstances the prns are to be triggered using an action plan format (see ABC below) .  Total time for H and P, chart review, counseling, reviewing hfa/neb  device(s) and generating customized AVS unique to this office visit / same day charting = 30 min             Patient Instructions  Try symbicort  80  Take 2 puffs first thing in am and then another 2 puffs about 12 hours later.   Plan A = Automatic = Always=    Symbcort 80 Take 2 puffs first thing in am and then another 2 puffs about 12 hours later.    Work on inhaler technique:  relax and gently blow all the way out then take a nice smooth full deep breath back in, triggering the inhaler at same time you start breathing in.  Hold breath in for at least  5 seconds if you can. Blow out symbicort   thru nose. Rinse and gargle with water when done.  If mouth or throat bother you at all,  try brushing teeth/gums/tongue with arm and hammer toothpaste/ make a slurry and gargle and spit out.      Plan B = Backup (to supplement plan A, not to replace it) Use your albuterol  inhaler as a rescue medication to be used if you can't catch your breath by resting or slowing your pace  or doing a relaxed purse lip breathing pattern.  - The less you use it, the better it will work when you need it. - Ok to use the inhaler up to 2 puffs  every 4 hours if you must but call for appointment if use goes up over your usual need -  Don't leave home without it !!  (think of it like the spare tire or starter fluid for your car)   Plan C = Crisis (instead of Plan B but only if Plan B stops working) - only use your albuterol  nebulizer if you first try Plan B and it fails to help > ok to use the nebulizer up to every 4 hours but if start needing it regularly call for immediate appointment   My office will be contacting you by  phone for referral to [facility] ENT   - if you don't hear back from my office within one week please call us  back or notify us  thru MyChart and we'll address it right away.            Ozell America, MD 03/10/2024         Patient Instructions  Try symbicort  80  Take 2 puffs first thing in am and then another 2 puffs about 12 hours later.   Plan A = Automatic = Always=    Symbcort 80 Take 2 puffs first thing in am and then another 2 puffs about 12 hours later.    Work on inhaler technique:  relax and gently blow all the way out then take a nice smooth full deep breath back in, triggering the inhaler at same time you start breathing in.  Hold breath in for at least  5 seconds if you can. Blow out symbicort   thru nose. Rinse and gargle with water when done.  If mouth or throat bother you at all,  try brushing teeth/gums/tongue with arm and hammer toothpaste/ make a slurry and gargle and spit out.      Plan B = Backup (to supplement plan A, not to replace it) Use your albuterol  inhaler as a rescue medication to be used if you can't catch your breath by resting or slowing your pace  or doing a relaxed purse lip breathing pattern.  - The less you use it, the better it will work when you need it. - Ok to use the inhaler up to 2 puffs  every 4 hours if you must but call for appointment if use goes up over your usual need - Don't leave home without it !!  (think of it like the spare tire or starter fluid for your car)   Plan C = Crisis (instead of Plan B but only if Plan B stops working) - only use your  albuterol  nebulizer if you first try Plan B and it fails to help > ok to use the nebulizer up to every 4 hours but if start needing it regularly call for immediate appointment   My office will be contacting you by phone for referral to [facility]   - if you don't hear back from my office within one week please call us  back or notify us  thru MyChart and we'll address it right away.            Ozell America, MD 03/10/2024

## 2024-03-10 NOTE — Assessment & Plan Note (Addendum)
 Onset around 2015 with prominent upper airway cough pattern and clear lungs on 10/26/2023  - hfa baseline 10/26/2023 around 0 > improved to 50% for hfa  - 10/26/2023 max gerd rx and cyclical cough rx with tyl #3 and bud/alb neb bid  -  11/24/2023  After extensive coaching inhaler device,  effectiveness =    80% (short ti) still having some breakthru symptoms at hs but not clear it's asthma vs vcd vs osa so rec symb increase to 160/gerd rx > f/u in 32m - Allergy screen 11/24/2023 >  Eos 0.2/  IgE  79   - 03/08/2024 improved but still excess throat clearing/ globus >>>> no evidence wheeze/ breathing better so rec reduce symbicort  to 80 2bid   >>>> referred to ENT ? Gabapentin next?  Discussed in detail all the  indications, usual  risks and alternatives  relative to the benefits with patient who agrees to proceed with Rx as outlined.

## 2024-03-10 NOTE — Assessment & Plan Note (Addendum)
 Body mass index is 46.24 kg/m.  -    No results found for: TSH    Contributing to doe and risk of GERD/dvt/ pe >>>   reviewed the need and the process to achieve and maintain neg calorie balance > defer f/u primary care including intermittently monitoring thyroid status    Assessment & Plan Cough variant asthma Onset around 2015 with prominent upper airway cough pattern and clear lungs on 10/26/2023  - hfa baseline 10/26/2023 around 0 > improved to 50% for hfa  - 10/26/2023 max gerd rx and cyclical cough rx with tyl #3 and bud/alb neb bid  -  11/24/2023  After extensive coaching inhaler device,  effectiveness =    80% (short ti) still having some breakthru symptoms at hs but not clear it's asthma vs vcd vs osa so rec symb increase to 160/gerd rx > f/u in 42m - Allergy screen 11/24/2023 >  Eos 0.2/  IgE  79   - 03/08/2024 improved but still excess throat clearing/ globus >>>> no evidence wheeze/ breathing better so rec reduce symbicort  to 80 2bid   >>>> referred to ENT ? Gabapentin next?  Discussed in detail all the  indications, usual  risks and alternatives  relative to the benefits with patient who agrees to proceed with Rx as outlined.     Cough variant asthma vs UACS Onset around 2015 with prominent upper airway cough pattern and clear lungs on 10/26/2023  - hfa baseline 10/26/2023 around 0 > improved to 50% for hfa  - 10/26/2023 max gerd rx and cyclical cough rx with tyl #3 and bud/alb neb bid  -  11/24/2023  After extensive coaching inhaler device,  effectiveness =    80% (short ti) still having some breakthru symptoms at hs but not clear it's asthma vs vcd vs osa so rec symb increase to 160/gerd rx > f/u in 34m - Allergy screen 11/24/2023 >  Eos 0.2/  IgE  79   - 03/08/2024 improved but still excess throat clearing/ globus >>>> no evidence wheeze/ breathing better so rec reduce symbicort  to 80 2bid   >>>> referred to ENT ? Gabapentin next?  Discussed in detail all the  indications, usual  risks  and alternatives  relative to the benefits with patient who agrees to proceed with Rx as outlined.     Morbid obesity (HCC) Body mass index is 46.24 kg/m.  -    No results found for: TSH    Contributing to doe and risk of GERD/dvt/ pe >>>   reviewed the need and the process to achieve and maintain neg calorie balance > defer f/u primary care including intermittently monitoring thyroid status           Each maintenance medication was reviewed in detail including emphasizing most importantly the difference between maintenance and prns and under what circumstances the prns are to be triggered using an action plan format (see ABC below) .  Total time for H and P, chart review, counseling, reviewing hfa/neb  device(s) and generating customized AVS unique to this office visit / same day charting = 30 min             Patient Instructions  Try symbicort  80  Take 2 puffs first thing in am and then another 2 puffs about 12 hours later.   Plan A = Automatic = Always=    Symbcort 80 Take 2 puffs first thing in am and then another 2 puffs about 12 hours later.  Work on inhaler technique:  relax and gently blow all the way out then take a nice smooth full deep breath back in, triggering the inhaler at same time you start breathing in.  Hold breath in for at least  5 seconds if you can. Blow out symbicort   thru nose. Rinse and gargle with water when done.  If mouth or throat bother you at all,  try brushing teeth/gums/tongue with arm and hammer toothpaste/ make a slurry and gargle and spit out.      Plan B = Backup (to supplement plan A, not to replace it) Use your albuterol  inhaler as a rescue medication to be used if you can't catch your breath by resting or slowing your pace  or doing a relaxed purse lip breathing pattern.  - The less you use it, the better it will work when you need it. - Ok to use the inhaler up to 2 puffs  every 4 hours if you must but call for appointment if use goes up  over your usual need - Don't leave home without it !!  (think of it like the spare tire or starter fluid for your car)   Plan C = Crisis (instead of Plan B but only if Plan B stops working) - only use your albuterol  nebulizer if you first try Plan B and it fails to help > ok to use the nebulizer up to every 4 hours but if start needing it regularly call for immediate appointment   My office will be contacting you by phone for referral to [facility] ENT   - if you don't hear back from my office within one week please call us  back or notify us  thru MyChart and we'll address it right away.            Ozell America, MD 03/10/2024

## 2024-03-10 NOTE — Assessment & Plan Note (Signed)
 Onset around 2015 with prominent upper airway cough pattern and clear lungs on 10/26/2023  - hfa baseline 10/26/2023 around 0 > improved to 50% for hfa  - 10/26/2023 max gerd rx and cyclical cough rx with tyl #3 and bud/alb neb bid  -  11/24/2023  After extensive coaching inhaler device,  effectiveness =    80% (short ti) still having some breakthru symptoms at hs but not clear it's asthma vs vcd vs osa so rec symb increase to 160/gerd rx > f/u in 32m - Allergy screen 11/24/2023 >  Eos 0.2/  IgE  79   - 03/08/2024 improved but still excess throat clearing/ globus >>>> no evidence wheeze/ breathing better so rec reduce symbicort  to 80 2bid   >>>> referred to ENT ? Gabapentin next?  Discussed in detail all the  indications, usual  risks and alternatives  relative to the benefits with patient who agrees to proceed with Rx as outlined.

## 2024-03-16 ENCOUNTER — Encounter (INDEPENDENT_AMBULATORY_CARE_PROVIDER_SITE_OTHER): Payer: Self-pay

## 2024-03-19 ENCOUNTER — Encounter (HOSPITAL_BASED_OUTPATIENT_CLINIC_OR_DEPARTMENT_OTHER): Payer: Self-pay | Admitting: Adult Health

## 2024-03-19 ENCOUNTER — Ambulatory Visit (HOSPITAL_BASED_OUTPATIENT_CLINIC_OR_DEPARTMENT_OTHER): Admitting: Adult Health

## 2024-04-06 ENCOUNTER — Telehealth: Payer: Self-pay | Admitting: Primary Care

## 2024-04-06 DIAGNOSIS — J45991 Cough variant asthma: Secondary | ICD-10-CM

## 2024-04-06 NOTE — Telephone Encounter (Signed)
 Copied from CRM [id number]. Topic: Clinical - Prescription Issue >> Apr 06, 2024  3:44 PM Nathanel DEL wrote: Reason for CRM: pt was prescribed  budesonide -formoterol  (SYMBICORT ) 80-4.5 MCG/ACT inhaler But it appears as though it says Class: No Printprinted.   Can you send in electronically this Rx for the pt? Or does she need to wait for the dr?  [id number] - [place], [place] - [address]

## 2024-04-11 MED ORDER — BUDESONIDE-FORMOTEROL FUMARATE 80-4.5 MCG/ACT IN AERO
INHALATION_SPRAY | RESPIRATORY_TRACT | 5 refills | Status: AC
Start: 2024-04-11 — End: ?

## 2024-05-07 ENCOUNTER — Encounter (INDEPENDENT_AMBULATORY_CARE_PROVIDER_SITE_OTHER): Payer: Self-pay | Admitting: Otolaryngology

## 2024-05-07 ENCOUNTER — Ambulatory Visit (INDEPENDENT_AMBULATORY_CARE_PROVIDER_SITE_OTHER): Admitting: Otolaryngology

## 2024-05-07 VITALS — BP 115/78 | HR 55 | Temp 98.5°F | Ht 62.0 in | Wt 240.0 lb

## 2024-05-07 DIAGNOSIS — R0989 Other specified symptoms and signs involving the circulatory and respiratory systems: Secondary | ICD-10-CM | POA: Diagnosis not present

## 2024-05-07 DIAGNOSIS — R0982 Postnasal drip: Secondary | ICD-10-CM | POA: Diagnosis not present

## 2024-05-07 DIAGNOSIS — K219 Gastro-esophageal reflux disease without esophagitis: Secondary | ICD-10-CM | POA: Diagnosis not present

## 2024-05-07 DIAGNOSIS — R0981 Nasal congestion: Secondary | ICD-10-CM | POA: Diagnosis not present

## 2024-05-07 DIAGNOSIS — J3089 Other allergic rhinitis: Secondary | ICD-10-CM

## 2024-05-07 MED ORDER — LEVOCETIRIZINE DIHYDROCHLORIDE 5 MG PO TABS: 5.0000 mg | ORAL_TABLET | Freq: Every evening | ORAL | 3 refills | Status: AC

## 2024-05-07 MED ORDER — FLUTICASONE PROPIONATE 50 MCG/ACT NA SUSP
2.0000 | Freq: Two times a day (BID) | NASAL | 6 refills | Status: AC
Start: 2024-05-07 — End: ?

## 2024-05-07 NOTE — Progress Notes (Signed)
 ENT CONSULT:  Reason for Consult: chronic throat clearing and post-nasal drip   HPI: Discussed the use of AI scribe software for clinical note transcription with the patient, who gave verbal consent to proceed.  History of Present Illness Heather Nicholson is a 38 year old female with chronic cough who presents with throat clearing and sinus issues.  She has a long-standing history of chronic throat clearing and reports having 'bad sinuses'. She is not currently using any nasal sprays or allergy medications.  She is on Protonix  twice daily for reflux. She has a history of asthma and uses Symbicort  in the morning and at night, along with an albuterol  inhaler. Additionally, she has a nebulizer machine with albuterol  and Symbicort  in vial form for use as needed.  She underwent allergy testing at an ENT in [place] about two to three years ago, which involved blood tests. The results indicated allergies to scallops and shrimp, although she does not experience allergic reactions when consuming them.  She reports burping hours after eating, depending on the food consumed. She was initially prescribed pantoprazole  by a gastroenterologist and is scheduled for a follow-up next month.   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 No pertinent surgical history.  History reviewed. No pertinent family history.  Social History:  reports that she has never smoked. She has never  used smokeless tobacco. She reports current alcohol use. She reports that she does not use drugs.  Allergies:  Allergies  Allergen Reactions   Latex Hives and Itching    With condoms    Sulfa  Antibiotics Itching   Citrus Rash    Medications: I have reviewed the patient's current medications.  The PMH, PSH, Medications, Allergies, and SH were reviewed and updated.  ROS: Constitutional: Negative for fever, weight loss and weight gain. Cardiovascular: Negative for chest pain and dyspnea on exertion. Respiratory: Is not experiencing shortness of breath at rest. Gastrointestinal: Negative for nausea and vomiting. Neurological: Negative for headaches. Psychiatric: The patient is not nervous/anxious  Blood pressure 115/78, pulse (!) 55, temperature 98.5 F (36.9 C), height 5' 2 (1.575 m), weight 240 lb (108.9 kg), SpO2 94%. Body mass index is 43.9 kg/m.  PHYSICAL EXAM:  Exam: General: Well-developed, well-nourished Respiratory Respiratory effort: Equal inspiration and expiration without stridor Cardiovascular Peripheral Vascular: Warm extremities with equal color/perfusion Eyes: No nystagmus with equal extraocular motion bilaterally Neuro/Psych/Balance: Patient oriented to person, place, and time; Appropriate mood and affect; Gait is intact with no imbalance; Cranial nerves I-XII are intact Head and Face Inspection: Normocephalic and atraumatic without mass or lesion Palpation: Facial skeleton intact without bony stepoffs Salivary Glands: No mass or tenderness Facial Strength: Facial motility symmetric and full bilaterally ENT Pinna: External ear intact and fully developed External canal: Canal is patent with intact skin Tympanic Membrane: Clear and mobile External Nose: No scar or anatomic deformity Internal Nose: Septum is  straight. No polyp, or purulence. Mucosal edema and erythema present.  Bilateral inferior turbinate hypertrophy.  Lips, Teeth, and gums: Mucosa and  teeth intact and viable TMJ: No pain to palpation with full mobility Oral cavity/oropharynx: No erythema or exudate, no lesions present Nasopharynx: No mass or lesion with intact mucosa Hypopharynx: Intact mucosa without pooling of secretions Larynx Glottic: Full true vocal cord mobility without lesion or mass Supraglottic: Normal appearing epiglottis and AE folds Interarytenoid Space: Moderate pachydermia&edema Subglottic Space: Patent without lesion or edema Neck Neck and Trachea: Midline trachea without mass or lesion Thyroid: No mass or nodularity Lymphatics: No lymphadenopathy  Procedure: Preoperative diagnosis: chronic throat clearing   Postoperative diagnosis:   Same + GERD LPR  Procedure: Flexible fiberoptic laryngoscopy  Surgeon: Novah Goza, MD  Anesthesia: Topical lidocaine and Afrin Complications: None Condition is stable throughout exam  Indications and consent:  The patient presents to the clinic with above symptoms. Indirect laryngoscopy view was incomplete. Thus it was recommended that they undergo a flexible fiberoptic laryngoscopy. All of the risks, benefits, and potential complications were reviewed with the patient preoperatively and verbal informed consent was obtained.  Procedure: The patient was seated upright in the clinic. Topical lidocaine and Afrin were applied to the nasal cavity. After adequate anesthesia had occurred, I then proceeded to pass the flexible telescope into the nasal cavity. The nasal cavity was patent without rhinorrhea or polyp. The nasopharynx was also patent without mass or lesion. The base of tongue was visualized and was normal. There were no signs of pooling of secretions in the piriform sinuses. The true vocal folds were mobile bilaterally. There were no signs of glottic or supraglottic mucosal lesion or mass. There was moderate interarytenoid pachydermia and post cricoid edema. The telescope was then slowly withdrawn and the  patient tolerated the procedure throughout.    Studies Reviewed: CXR 12/13/2013 FINDINGS:  The heart size and mediastinal contours are within normal limits.  Both lungs are clear. The visualized skeletal structures are  unremarkable.   IMPRESSION:  No active cardiopulmonary disease.   Assessment/Plan: Encounter Diagnoses  Name Primary?   Chronic throat clearing Yes   Environmental and seasonal allergies    Chronic nasal congestion    Post-nasal drip     Assessment and Plan Assessment & Plan Chronic throat clearing Chronic throat clearing likely related to postnasal drainage and GERD. Flexible laryngoscopy unremarkable, aside from changes c/w GERD LPR. Silent reflux symptoms considered. Protonix  alone may not suffice. - Continue Protonix  as prescribed. - Advise dietary modifications to avoid foods and drinks that promote reflux. - Recommend Reflux Gourmet reflux supplement to be taken after meals. - Prescribed Xyzal for allergy management. - Prescribed Flonase nasal spray. - Refer to allergist for further testing. - Follow up with gastroenterologist as planned next month for burping after eating  Chronic nasal congestion and post-nasal drainage No current use of nasal sprays or allergy pills. - Prescribed Xyzal for allergy management. - Prescribed Flonase nasal spray. - Refer to allergist for further testing.  Gastroesophageal reflux disease (GERD) GERD contributing to throat clearing. Symptoms may include silent reflux. Protonix  is currently used, but additional measures recommended. - Continue Protonix  as prescribed. -  Reflux Gourmet after meals - diet and lifestyle changes to minimize GERD - Refer to [employer] blog for dietary and lifestyle modifications/reflux cook book   Thank you for allowing me to participate in the care of this patient. Please do not hesitate to contact me with any questions or concerns.  Elena Larry, MD Otolaryngology [facility] Phone: [phone] Fax: [phone]    05/07/2024, 9:14 AM

## 2024-05-07 NOTE — Patient Instructions (Signed)
